# Patient Record
Sex: Female | Born: 1950 | Race: White | Hispanic: No | Marital: Married | Smoking: Former smoker
Health system: Southern US, Academic
[De-identification: ages and names within clinical notes are randomized; demographics above are authoritative.]

## PROBLEM LIST (undated history)

## (undated) DIAGNOSIS — R51 Headache: Secondary | ICD-10-CM

## (undated) DIAGNOSIS — K5792 Diverticulitis of intestine, part unspecified, without perforation or abscess without bleeding: Secondary | ICD-10-CM

## (undated) DIAGNOSIS — R519 Headache, unspecified: Secondary | ICD-10-CM

## (undated) DIAGNOSIS — R569 Unspecified convulsions: Secondary | ICD-10-CM

## (undated) DIAGNOSIS — E039 Hypothyroidism, unspecified: Secondary | ICD-10-CM

## (undated) DIAGNOSIS — R12 Heartburn: Secondary | ICD-10-CM

## (undated) DIAGNOSIS — G43909 Migraine, unspecified, not intractable, without status migrainosus: Secondary | ICD-10-CM

## (undated) DIAGNOSIS — E079 Disorder of thyroid, unspecified: Secondary | ICD-10-CM

## (undated) DIAGNOSIS — M810 Age-related osteoporosis without current pathological fracture: Secondary | ICD-10-CM

## (undated) HISTORY — PX: COLOSTOMY REVERSAL: SHX5782

## (undated) HISTORY — PX: APPENDECTOMY: SHX54

## (undated) HISTORY — DX: Age-related osteoporosis without current pathological fracture: M81.0

## (undated) HISTORY — PX: HX APPENDECTOMY: SHX54

## (undated) HISTORY — DX: Migraine, unspecified, not intractable, without status migrainosus: G43.909

---

## 2003-07-05 ENCOUNTER — Encounter: Payer: Self-pay | Admitting: Unknown Physician Specialty

## 2003-07-05 ENCOUNTER — Ambulatory Visit (HOSPITAL_COMMUNITY): Admission: RE | Admit: 2003-07-05 | Discharge: 2003-07-05 | Payer: Self-pay | Admitting: Unknown Physician Specialty

## 2005-03-21 ENCOUNTER — Ambulatory Visit: Payer: Self-pay | Admitting: Family Medicine

## 2005-06-05 ENCOUNTER — Ambulatory Visit: Payer: Self-pay | Admitting: Family Medicine

## 2005-08-12 ENCOUNTER — Ambulatory Visit: Payer: Self-pay | Admitting: Family Medicine

## 2006-08-12 ENCOUNTER — Ambulatory Visit (HOSPITAL_COMMUNITY): Admission: RE | Admit: 2006-08-12 | Discharge: 2006-08-12 | Payer: Self-pay | Admitting: Family Medicine

## 2006-08-25 ENCOUNTER — Ambulatory Visit (HOSPITAL_COMMUNITY): Admission: RE | Admit: 2006-08-25 | Discharge: 2006-08-25 | Payer: Self-pay | Admitting: *Deleted

## 2006-09-01 ENCOUNTER — Ambulatory Visit (HOSPITAL_COMMUNITY): Admission: RE | Admit: 2006-09-01 | Discharge: 2006-09-01 | Payer: Self-pay | Admitting: *Deleted

## 2006-09-18 ENCOUNTER — Ambulatory Visit (HOSPITAL_COMMUNITY): Admission: RE | Admit: 2006-09-18 | Discharge: 2006-09-18 | Payer: Self-pay | Admitting: *Deleted

## 2007-01-07 ENCOUNTER — Inpatient Hospital Stay (HOSPITAL_COMMUNITY): Admission: EM | Admit: 2007-01-07 | Discharge: 2007-01-12 | Payer: Self-pay | Admitting: Emergency Medicine

## 2007-01-07 ENCOUNTER — Ambulatory Visit (HOSPITAL_COMMUNITY): Admission: RE | Admit: 2007-01-07 | Discharge: 2007-01-07 | Payer: Self-pay | Admitting: *Deleted

## 2007-02-02 ENCOUNTER — Inpatient Hospital Stay (HOSPITAL_COMMUNITY): Admission: RE | Admit: 2007-02-02 | Discharge: 2007-03-04 | Payer: Self-pay | Admitting: *Deleted

## 2007-02-06 ENCOUNTER — Encounter (INDEPENDENT_AMBULATORY_CARE_PROVIDER_SITE_OTHER): Payer: Self-pay | Admitting: Specialist

## 2007-04-06 ENCOUNTER — Inpatient Hospital Stay (HOSPITAL_COMMUNITY): Admission: RE | Admit: 2007-04-06 | Discharge: 2007-04-14 | Payer: Self-pay | Admitting: *Deleted

## 2008-09-14 IMAGING — CT CT ABCESS DRAINAGE
3 of 4 series · 15 of 32 positions shown, 20 images · non-contrast
Comparison: none

CLINICAL DATA: Recurrent diverticular abscess

CT guided pelvic drain abscess catheter placement:
TECHNIQUE: Select axial scans through the pelvis were obtained and an
appropriate skin entry site determined. Skin site was marked, prepped with
Betadine, and draped in usual sterile fashion, and infiltrated locally with 1%
lidocaine. Intravenous fentanyl and Versed were administered as conscious
sedation during continuous cardiorespiratory monitoring by the radiology RN.

[Series 2: diverticular abs 5.0 b40f st · axial · 0.76mm/px · z∈[-264,-204]mm · 8 of 16 slices shown, 13 images (1 of 2)]
[im 2/16  soft-tissue]
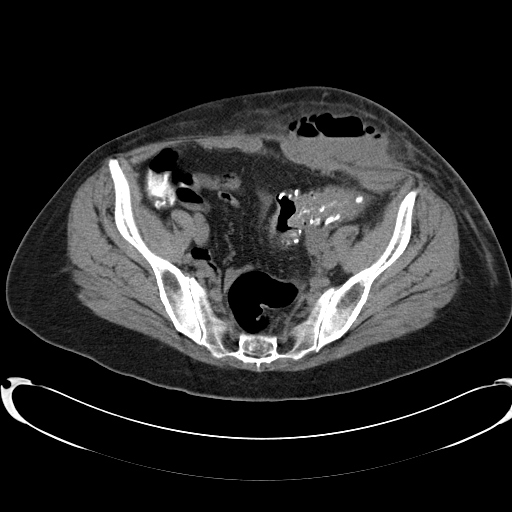
[im 2/16  bone]
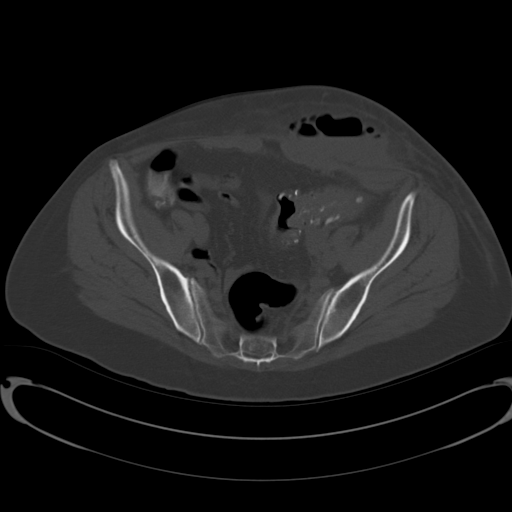
[im 4/16  soft-tissue]
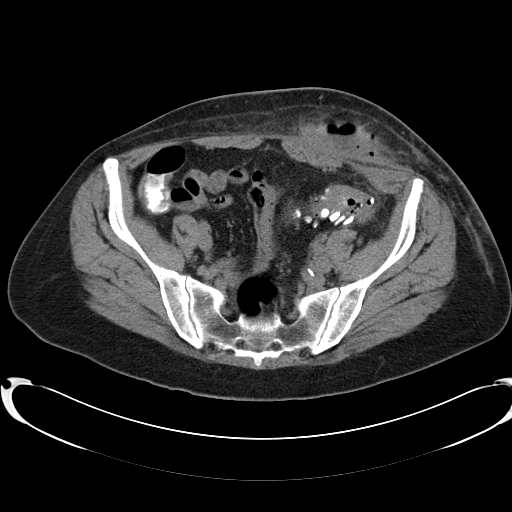
[im 6/16  soft-tissue]
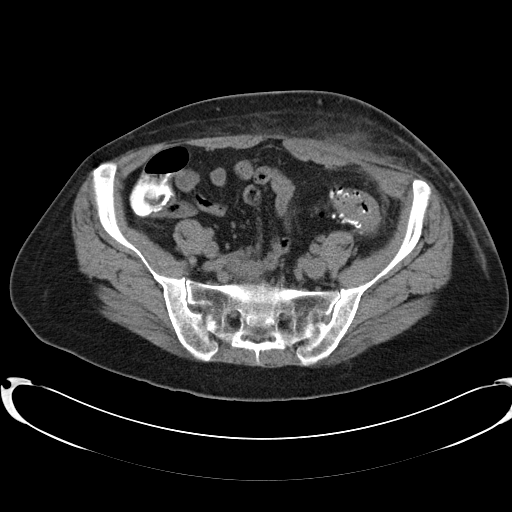
[im 7/16  soft-tissue]
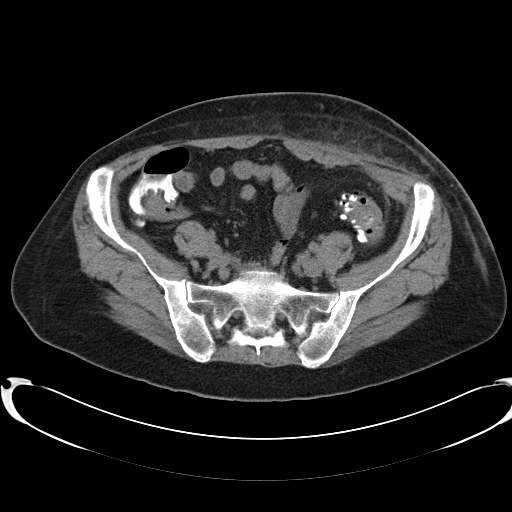
[im 9/16  soft-tissue]
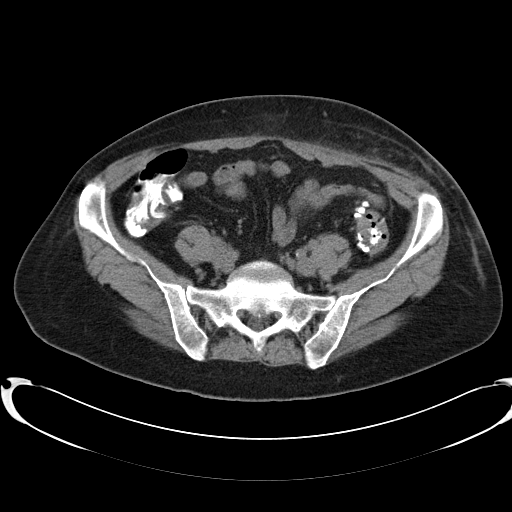
[im 9/16  lung]
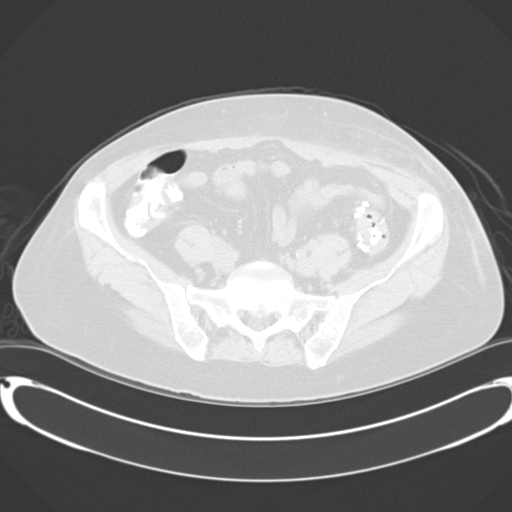
[im 11/16  soft-tissue]
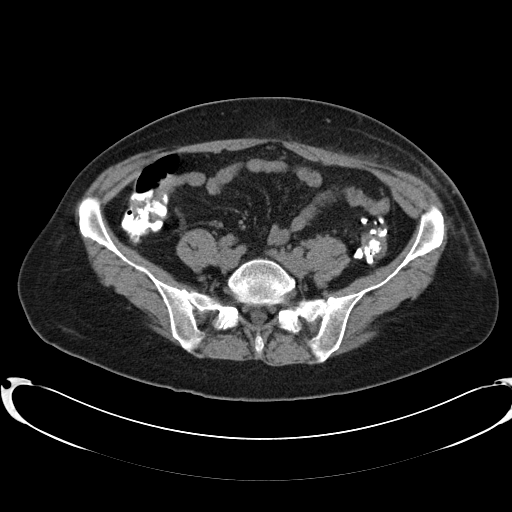
[im 11/16  lung]
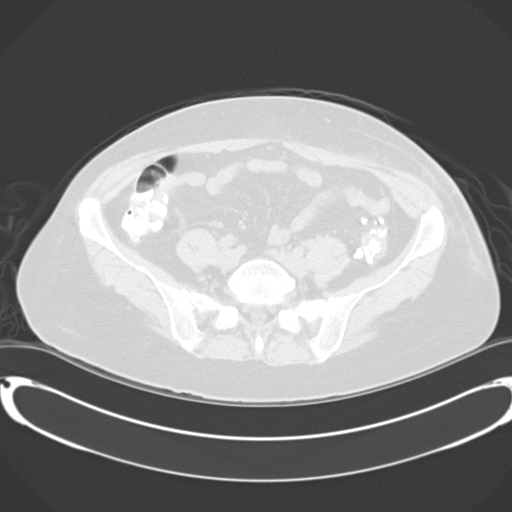
[im 12/16  soft-tissue]
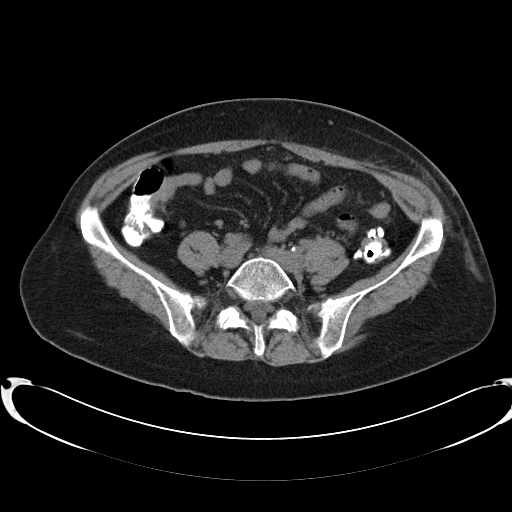
[im 12/16  lung]
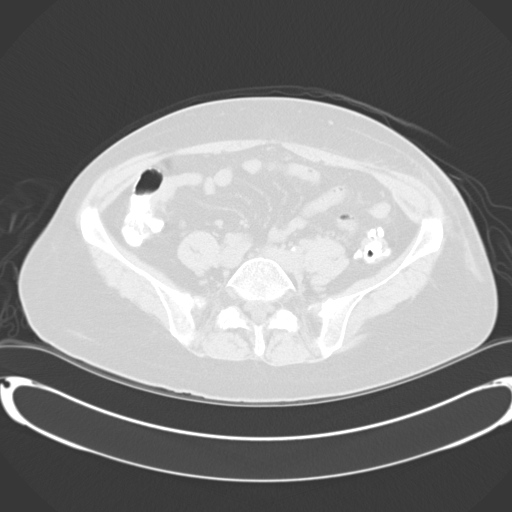
[im 14/16  soft-tissue]
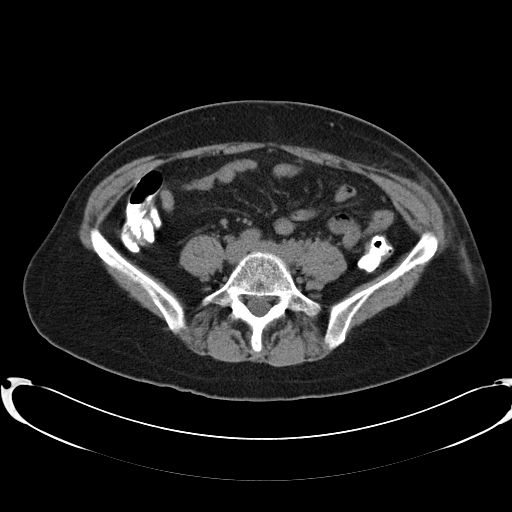
[im 14/16  lung]
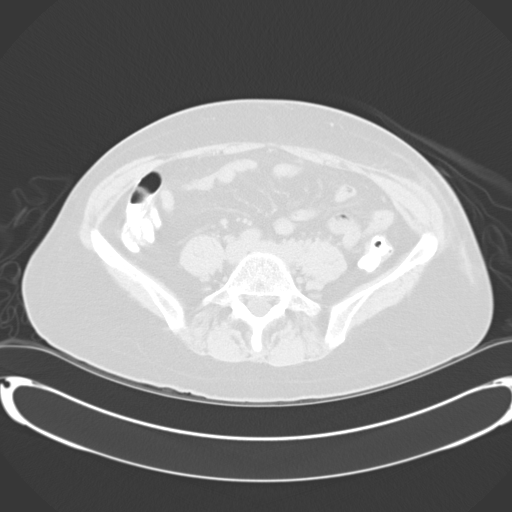

[Series 3: diverticular abs 5.0 b40f st · axial · 0.76mm/px · z∈[-289,-259]mm · 4 of 10 slices shown (2 of 2)]
[im 2/10  soft-tissue]
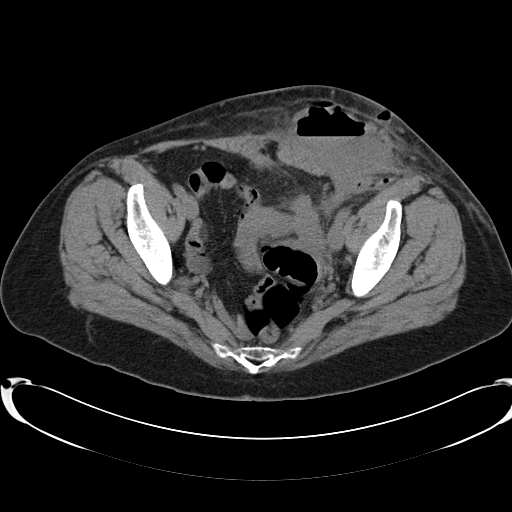
[im 4/10  soft-tissue]
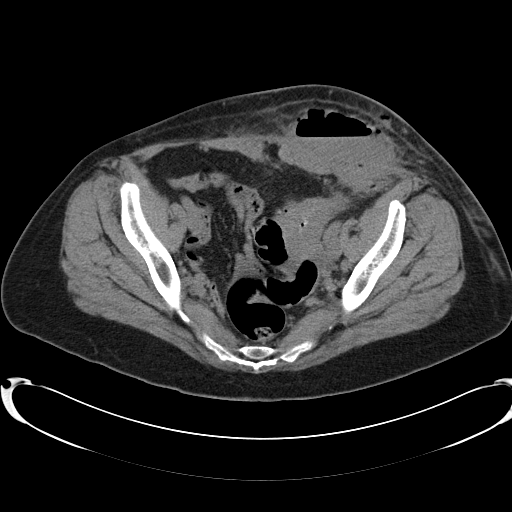
[im 6/10  soft-tissue]
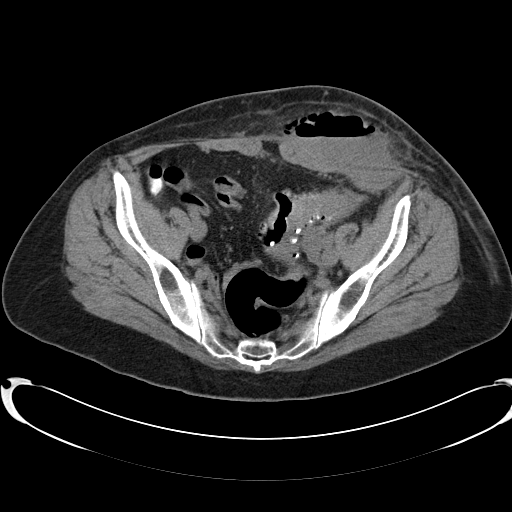
[im 8/10  soft-tissue]
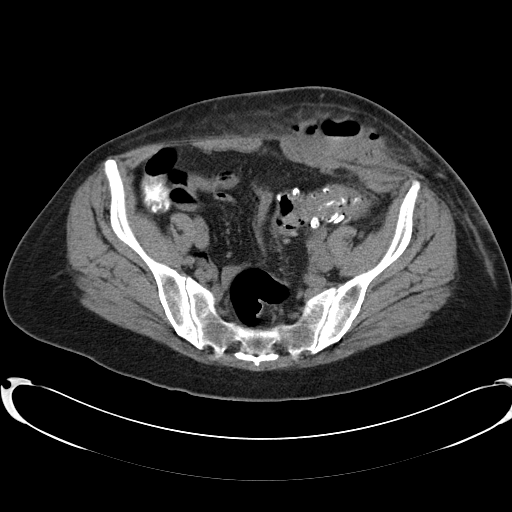

[Series 5: (hospital) 6.0 b30f · axial · 1.48mm/px · z∈[-296,-292]mm · 3 of 24 slices shown]
[im 2/24  soft-tissue]
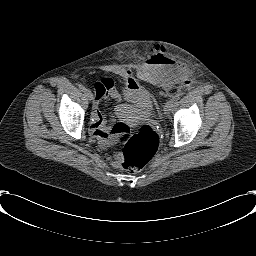
[im 5/24  soft-tissue]
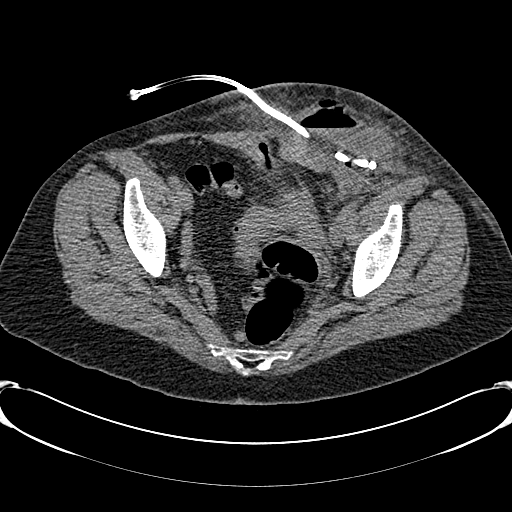
[im 9/24  soft-tissue]
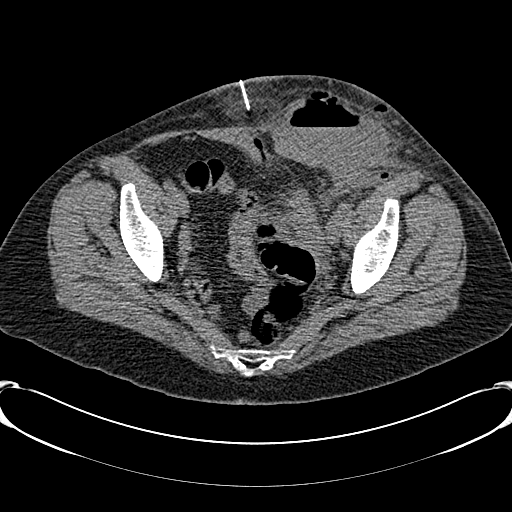

[15 of 32 positions shown; findings below may reference images not displayed]

Under CT fluoroscopic guidance a 19-gauge needle was advanced into the left deep
subcutaneous pelvic gas and fluid collection. Purulent material could be
aspirated. The needle was exchanged over guidewire for serial vascular dilators
which allowed advancement of a 12 French pigtail catheter, formed within the
dependent aspect of the collection. Approximately 65 mL of purulent fluid were
aspirated, a sample sent for gram stain culture. Catheter secured externally
with 0 Prolene suture and placed to drain bag. Patient tolerated procedure well,
with no immediate complication.
IMPRESSION: 1. Technically successful CT guided pelvic abscess drain catheter placement.

## 2008-12-14 ENCOUNTER — Ambulatory Visit (HOSPITAL_COMMUNITY)
Admission: RE | Admit: 2008-12-14 | Discharge: 2008-12-14 | Disposition: A | Discharge status: Home / Self Care | Payer: Self-pay | Source: Ambulatory Visit

## 2009-01-16 ENCOUNTER — Ambulatory Visit (HOSPITAL_COMMUNITY)
Admission: RE | Admit: 2009-01-16 | Discharge: 2009-01-16 | Disposition: A | Discharge status: Home / Self Care | Payer: Self-pay | Source: Ambulatory Visit

## 2009-09-11 ENCOUNTER — Ambulatory Visit (HOSPITAL_COMMUNITY)
Admission: RE | Admit: 2009-09-11 | Discharge: 2009-09-11 | Disposition: A | Discharge status: Home / Self Care | Payer: Self-pay | Source: Ambulatory Visit

## 2010-12-26 ENCOUNTER — Emergency Department (HOSPITAL_COMMUNITY)
Admission: EM | Admit: 2010-12-26 | Discharge: 2010-12-26 | Payer: Self-pay | Source: Home / Self Care | Admitting: Emergency Medicine

## 2010-12-30 LAB — COMPREHENSIVE METABOLIC PANEL
ALT: 16 U/L (ref 0–35)
AST: 18 U/L (ref 0–37)
Albumin: 3.9 g/dL (ref 3.5–5.2)
Alkaline Phosphatase: 80 U/L (ref 39–117)
BUN: 12 mg/dL (ref 6–23)
Chloride: 104 mEq/L (ref 96–112)
Potassium: 3.9 mEq/L (ref 3.5–5.1)
Sodium: 140 mEq/L (ref 135–145)
Total Bilirubin: 0.8 mg/dL (ref 0.3–1.2)
Total Protein: 7.2 g/dL (ref 6.0–8.3)

## 2010-12-30 LAB — CBC
MCV: 94.1 fL (ref 78.0–100.0)
Platelets: 312 10*3/uL (ref 150–400)
RBC: 5.09 MIL/uL (ref 3.87–5.11)
RDW: 13.1 % (ref 11.5–15.5)
WBC: 13.6 10*3/uL — ABNORMAL HIGH (ref 4.0–10.5)

## 2010-12-30 LAB — DIFFERENTIAL
Basophils Absolute: 0 10*3/uL (ref 0.0–0.1)
Eosinophils Absolute: 0.1 10*3/uL (ref 0.0–0.7)
Eosinophils Relative: 1 % (ref 0–5)
Lymphocytes Relative: 30 % (ref 12–46)
Lymphs Abs: 4 10*3/uL (ref 0.7–4.0)
Neutrophils Relative %: 63 % (ref 43–77)

## 2011-04-25 NOTE — H&P (Signed)
[name], [name] NO.:  [id number]   MEDICAL RECORD NO.:  [id number]          PATIENT TYPE:  OUT   LOCATION:  CATS                         FACILITY:  [facility]   PHYSICIAN:  Clovis Pu. Cornett, M.D.DATE OF BIRTH:  [birth year]   DATE OF ADMISSION:  01/07/2007  DATE OF DISCHARGE:                              HISTORY & PHYSICAL   CHIEF COMPLAINT:  Abdominal pain.   HISTORY OF PRESENT ILLNESS:  The patient is a 60 year old female with A  past history of acute diverticulitis complicated by abscess back in  September of 2007.  She underwent percutaneous drainage and antibiotics.  She had a second attack a month later which was treated medically.  She  represents to the office with a 5-day history of severe left lower  quadrant pain.  There is no radiation..  The pain is made worse by  moving or sitting upward.  She also complains of passing air and having  pain with urination consisting of burning and passage of air after she  passes her urine.  This has been going on for the past 2 months.  She  denies any weight loss, blood in her stool or urine.   PAST MEDICAL HISTORY:  1. History of acute diverticulitis complicated by abscess back in      September 2007 seen by Dr. Colin Benton  2. History of tobacco abuse.  3. Hypothyroidism.   PAST SURGICAL HISTORY:  1. Appendectomy.  2. Hysterectomy.   ALLERGIES:  None.   MEDICATIONS INCLUDE:  1. Synthroid 125 mcg daily.  2. She is not Cipro and Flagyl at home.  3. Vicodin as needed for pain.   SOCIAL HISTORY:  Positive for tobacco use.  Denies alcohol use.   FAMILY HISTORY:  Positive for hypertension.   REVIEW OF SYSTEMS:  A 15-point review of systems is as stated, as above,  otherwise negative.   PHYSICAL EXAM:  VITAL SIGNS:  Temperature 97, pulse 67, respiratory rate  is 20.  GENERAL APPEARANCE:  White female in no apparent distress.  HEENT:  Extraocular movements are intact.  Oropharynx clear.  NECK:  Supple,  nontender.  \CHEST:  Clear to auscultation.  CARDIOVASCULAR:  Regular rate and rhythm without murmur or gallop.  ABDOMEN:  Tender in the left lower quadrant without diffuse peritonitis.  There is a palpable mass in left lower quadrant.  No hernia.  EXTREMITIES:  No clubbing, cyanosis, nor edema.   CT SCAN:  Reveals an abscess tracking from the sigmoid colon to her left  rectus muscle.  There was a second tract from her sigmoid colon to the  dome of her tumor bladder.  There is no free air or free fluid on CT.   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   PLAN:  She will be admitted for IV antibiotics, IV fluids n.p.o. status.  We will ask interventional radiology for percutaneous drainage in the  morning.      Thomas A. Cornett, M.D.  Electronically Signed  TAC/MEDQ  D:  01/07/2007  T:  01/07/2007  Job:  [id number]

## 2011-04-25 NOTE — Discharge Summary (Signed)
NAMEGABRIELL, DAIGNEAULT NO.:  [id number]   MEDICAL RECORD NO.:  [id number]          PATIENT TYPE:  INP   LOCATION:  1421                         [facility]ACILITY:  [facility]   PHYSICIAN:  Alfonse Ras, MD   DATE OF BIRTH:  1951/07/21   DATE OF ADMISSION:  02/01/2007  DATE OF DISCHARGE:  03/04/2007                               DISCHARGE SUMMARY   ADMISSION DIAGNOSIS:  History of recurring sigmoid diverticulitis.   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   DISPOSITION:  Discharged to home.   DISCHARGE MEDICATIONS:  Reglan 10 mg p.o. q.6h. p.r.n. nausea.   DISCHARGE FOLLOWUP:  Follow up with me in 2 weeks.   HOME CARE INSTRUCTIONS:  Twice daily dressing changes Normal Saline wet  to dry per home health care.   HOSPITAL COURSE:  The patient was admitted after recurrent  diverticulitis and treated with IV antibiotics.  The patient did not  resolve, had what appeared to be abdominal wall abscess and had a drain  placed within it.  The patient continued to have some emesis and some  drainage and low grade temperatures.  Her white count decreased.  We  started a gentle bowel prep, planning on doing a sigmoid colectomy.  Urology was consulted for placement of a ureteral stent.  On March 1,  the patient was taken to the operating room, underwent a Hartmann  procedure, take-down of colovesical fistula, Dr. Gaylyn Lambert was assisted.  Dr.  Londell Moh Kimbrough placed the ureteral stent.  The patient did well over  the first week and bowel function appeared to return.  Foley was kept in  place.  Wound was treated with Normal Saline wet to dry dressings.   By postoperative day #7 and 8, the patient continued with worsening  nausea, had difficulty taking down clear liquids.  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  Dr. Zachery Dakins managed the  patient in my absence.  NG tube was placed and Dobbhoff feeding tube was  placed.  The patient underwent PICC line placement and was started on  TNA.   By postoperative day #16, she was maintained on TNA and maintained on  antibiotics and feeding tube was placed by Interventional Radiology on  March 17.  This was maintained and replaced after it slipped back; but  by March 23, she was feeling much better and NG tube output was markedly  decreased.  She underwent a clamping trial which she tolerated over 24  hours with clear liquids.  NG tube was removed.  She was then started on  full liquids and then  advanced to a regular diet.  Panda tube was removed.  Wound was clean  and she was ready for discharge home by March 04, 2007.   She is discharged home.  Plans are for followup with me in 2-3 weeks and  home health care as described above.      Alfonse Ras,  MD  Electronically Signed     KRE/MEDQ  D:  03/04/2007  T:  03/04/2007  Job:  [id number]

## 2011-04-25 NOTE — Consult Note (Signed)
[name], [name] NO.:  [id number]   MEDICAL RECORD NO.:  [id number]          PATIENT TYPE:  OBV   LOCATION:  1421                         FACILITY:  [facility]   PHYSICIAN:  Bertram Millard. Dahlstedt, M.D.DATE OF BIRTH:  [birth year]   DATE OF CONSULTATION:  02/01/2007  DATE OF DISCHARGE:                                 CONSULTATION   REASON FOR CONSULTATION:  Colovesical fistula.   BRIEF HISTORY:  This is a 60 year old female who was readmitted today by  Dr. Simona Huh for treatment of abdominal wall abscess and recurrent  diverticulitis with colovesical fistula.  She initially presented  September of last year with an abscess.  She underwent drainage  percutaneously and IV antibiotic therapy.  She was treated again a month  later for this.  She was readmitted at the end of this January for  recurrent symptoms.  She underwent IV antibiotic management, and  percutaneous drainage of an abdominal wall abscess.  She has been  maintained on oral antibiotics since her discharge on February 5.  Over  the past few days, she has been getting worse, has had blood in her  urine, as well as air, as well as pneumatory.  She was readmitted for IV  antibiotic therapy, and drainage of this abscess percutaneously under CT  revealed this recurrent abscess.  Urologic consultation is requested to  consider ureteral catheterization to prevent injury to her left ureter  during her laparotomy.   MEDICAL HISTORY:  Significant for hypothyroidism.  She has a history of  tobacco use.  She has previously mentioned episodes of diverticulitis.  She has had a hysterectomy and appendectomy.   MEDICATIONS:  Time of admission to include Cipro, Flagyl and Synthroid.   ALLERGIES:  SHE DENIES ANY DRUG ALLERGIES.   SOCIAL HISTORY:  She is married.  She does not have children.  She does  use tobacco.  She denies alcohol use.   FAMILY HISTORY:  Significant for hypertension.   EXAM:  GENERAL:  Pleasant  middle-aged female.  VITAL SIGNS:  Blood pressure 123/86, pulse 102, respiratory rate 22,  temperature 100.6.  GI:  She had mild mildly protuberant abdomen with significant mass  effect and exquisite tenderness in the left lower quadrant.  Some  rebound and obvious guarding.  She had no peripheral edema.   White count 16,100, hematocrit 37.5%, platelet count 279,000.  BMET was  normal, except for glucose of 113 and sodium of 134.   IMPRESSION:  1. 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  2. Abdominal wall abscess.   PLAN:  1. Agree with a ventral stent placement.  At this needs to be done      urgently, I can do this under a separate anesthetic before her      laparotomy.  Otherwise, this can be performed at the same time as      her laparotomy.  2. I will be more than help more than happy to assist with      laparotomy/cystorrhaphy.      Bertram Millard. Dahlstedt, M.D.  Electronically Signed     SMD/MEDQ  D:  02/01/2007  T:  02/02/2007  Job:  [id number]   cc:   Alfonse Ras, MD  [address]  [place]  [place] [address]

## 2011-04-25 NOTE — H&P (Signed)
NAME:  [name], MCGLOIN NO.:  [id number]   MEDICAL RECORD NO.:  [id number]          PATIENT TYPE:  OBV   LOCATION:  1421                         FACILITY:  [facility]   PHYSICIAN:  Alfonse Ras, MD   DATE OF BIRTH:  1951-09-13   DATE OF ADMISSION:  02/01/2007  DATE OF DISCHARGE:                              HISTORY & PHYSICAL   HISTORY AND PHYSICAL:  Patient is a 60 year old white female with now  her third recurrent abscess in the left lower quadrant from sigmoid  diverticulitis.  She has been treated by percutaneous drainage and  conservative management, hoping to do a first stage procedure on her.  The patient now presents with a three day history of air per her  bladder, nausea, vomiting, and left lower quadrant fullness.  She  underwent CT scan as an outpatient, which shows recurrence of the  abscess with obvious fistula to the anterior abdominal wall with fluid  within it.   PAST MEDICAL HISTORY:  Unchanged since her last visit.   Medications include Synthroid, Cipro, Flagyl.   PAST SURGICAL HISTORY:  Significant for hysterectomy and appendectomy.   REVIEW OF SYSTEMS:  Significant, as above.   PHYSICAL EXAMINATION:  VITAL SIGNS:  Temperature 99.6, heart rate 78,  blood pressure 126/80, respiratory rate 16.  HEENT:  Benign.  Normocephalic and atraumatic.  Pupils are equal, round  and reactive to light.  LUNGS:  Clear to auscultation and percussion.  She does have left lower  lung bilateral rhonchi.  CARDIAC:  Regular rate and rhythm without murmurs, rubs or gallops.  ABDOMEN:  Full and quite tender in the left lower quadrant.  She does  have bowel sounds.   CT shows a recurrent abdominal wall abscess, as described above.   PLAN:  Admit.  IV antibiotics.  Probable placement of left ureteral  stent by urology.  Bowel prep and Hartmann colostomy at some point after  percutaneous drainage.      Alfonse Ras, MD  Electronically Signed     KRE/MEDQ  D:  02/01/2007  T:  02/01/2007  Job:  [id number]

## 2011-04-25 NOTE — Op Note (Signed)
[name], [name] NO.:  [id number]   MEDICAL RECORD NO.:  [id number]          PATIENT TYPE:  INP   LOCATION:  1421                         [facility]ACILITY:  [facility]   PHYSICIAN:  Alfonse Ras, MD   DATE OF BIRTH:  21-May-1951   DATE OF PROCEDURE:  02/06/2007  DATE OF DISCHARGE:                               OPERATIVE REPORT   PREOPERATIVE DIAGNOSIS:  Colovesical fistula and diverticulitis.   POSTOPERATIVE DIAGNOSIS:  Colovesical fistula and diverticulitis.   PROCEDURE:  Exploratory laparotomy Hartmann resection and takedown of  colovesical fistula.   SURGEON:  Dr. Baruch Merl.   ASSISTANT:  Dr. Jaclynn Guarneri.   ANESTHESIA:  General.   DESCRIPTION OF PROCEDURE:  The patient was taken to the operating room,  placed in a supine position and after adequate anesthesia was induced  using the endotracheal tube, the patient underwent left ureteral stent  placement by Dr. Vic Blackbird. At this point, a Foley catheter had  been placed and the abdomen was prepped and draped in the normal sterile  fashion. Using a vertical midline incision, I dissected down onto the  fascia and the fascia was opened.  There was a large amount of very  densely adhered omentum into the left lower quadrant.  This was  immobilized using the LigaSure.  The colon was then mobilized laterally  using Bovie electrocautery in the LigaSure.  The mucosa was seen on the  colonic side.  A GIA stapling device was then placed distal to the area  of inflammation protecting the bladder.  It was transected.  The  mesentery was taken down using the LigaSure.  The proximal colon in the  mid descending colon was transected using a GIA stapling device and some  of that mesentery was also taken down using the LigaSure.  There was  such dense inflammation of the omentum and adhesions to the bladder that  I opted not to try to close the bladder primarily.  A 19 Blake drain was  placed next to the bladder.   The abdomen was copiously irrigated and the  proximal colon was brought out through a cruciate incision in the left  anterior rectus sheath.  The remainder of the bowel appeared normal.  The fascia was closed with a running #1 Novofil.  The skin was closed  with interrupted staples and packed with saline-soaked gauze.  The  colostomy was matured in a standard fashion using interrupted 3-0  Vicryl. The colostomy device was placed, ureteral stent was removed.  A  sterile dressing was applied.  The patient tolerated the procedure well  and went to PACU in good condition.      Alfonse Ras, MD  Electronically Signed     KRE/MEDQ  D:  02/06/2007  T:  02/06/2007  Job:  [id number]

## 2011-04-25 NOTE — Discharge Summary (Signed)
[name], [name] NO.:  [id number]   MEDICAL RECORD NO.:  [id number]          PATIENT TYPE:  INP   LOCATION:  1525                         FACILITY:  [facility]   PHYSICIAN:  Alfonse Ras, MD   DATE OF BIRTH:  November 11, 1951   DATE OF ADMISSION:  04/06/2007  DATE OF DISCHARGE:  04/14/2007                               DISCHARGE SUMMARY   ADMISSION DIAGNOSIS:  Status post Hartmann procedure.   DISCHARGE DIAGNOSIS:  Status post Hartmann procedure.   PROCEDURES:  Include take down of colostomy and extensive lysis of  adhesions.   CONDITION ON DISCHARGE:  Good and improved.   FOLLOWUP:  With me in 2 weeks.   MEDICATIONS AT DISCHARGE:  Include Darvocet for pain and levothyroxine  daily.   HOSPITAL COURSE:  The patient was admitted after home bowel prep and  underwent extensive lysis of adhesions and takedown of colostomy.  She  suffered from some hypokalemia requiring repletion over the initial 3-4  days postop, but this resolved on postoperative day #6. Her NG tube was  removed.  She was started on clear liquids.  She tolerated that.  She  was then advanced to a regular diet which she tolerated well and was  ready for discharge home on Apr 14, 2007.      Alfonse Ras, MD  Electronically Signed     KRE/MEDQ  D:  04/14/2007  T:  04/14/2007  Job:  [id number]

## 2011-04-25 NOTE — Op Note (Signed)
NAMESMITA, LESH NO.:  [id number]   MEDICAL RECORD NO.:  [id number]          PATIENT TYPE:  INP   LOCATION:  1525                         [facility]ACILITY:  [facility]   PHYSICIAN:  Alfonse Ras, MD   DATE OF BIRTH:  [birth year]   DATE OF PROCEDURE:  04/06/2007  DATE OF DISCHARGE:                               OPERATIVE REPORT   PREOPERATIVE DIAGNOSIS:  Status post Hartmann procedure with colostomy.   POSTOPERATIVE DIAGNOSIS:  Status post Hartmann procedure with colostomy.  Extensive intra-abdominal adhesions.   PROCEDURE:  Laparoscopy, exploratory laparotomy, take down of colostomy  and extensive lysis of adhesions.   SURGEON:  Alfonse Ras, MD   ASSISTANT:  Anselm Pancoast. Zachery Dakins, M.D.   ANESTHESIA:  General.   DESCRIPTION:  The patient was taken to the operating room, placed in  supine position. After adequate general anesthesia was induced using  endotracheal tube the colostomy was closed using a pursestring suture.  The right upper quadrant was accessed with an 11 mm OptiVu trocar under  direct vision.  Peritoneal access was obtained.  Pneumoperitoneum was  obtained.  On visualization of the abdomen there was extensive adhesions  of the small bowel and omentum to the anterior abdominal wall.  I found  this prohibitive to further laparoscopic approach and I opted to open.   The previous lower abdominal vertical incision scar was removed in an  elliptical fashion.  Fascia was then incised and the abdomen was  entered.  The dense omental adhesions were taken down under tedious  dissection.  There were also extensive small bowel adhesions both near  the colostomy and in the pelvis.  This required probably about 90  minutes of extensive dissection to mobilize the small bowel from where  her previous percutaneous drains had been and her diverticulitis.  Once  the entire small bowel was mobilized it was ran from the ligament of  Treitz down to the terminal  ileum.  A small enterotomy in the mid  jejunum was identified and closed with interrupted 3-0 silk sutures.  The colostomy was then excised at the skin level, mobilized down to the  fascia and brought into the abdomen.  It was clamped with a spring clamp  proximal.  I then turned my attention to the pelvis.  The distal rectum  was identified with the previously placed Prolene sutures and was  mobilized using the LigaSure and with blunt and sharp dissection.  This  was right at the pelvic brim and therefore I opted to do a hand-sewn  anastomosis.  The end of the colostomy was then excised as was the  proximal end of the stump.  A single layer 2-0 silk interrupted closure  was then obtained starting on the posterior side.  Tisseel was placed  over the anastomosis.  Tisseel was also placed on the small enterotomy  in the small bowel.  The abdomen was copiously irrigated.  Adequate  hemostasis was assured.  The colostomy fascial defect was closed with  interrupted #1 Novofil.  The skin of the midline fascia was closed with  a  running #1 Novofil.  Skin was closed with staples.  Telfa was placed  in between the staples and the colostomy closure at the skin level.  Sterile dressings were applied.  The patient tolerated the procedure  well and went to PACU in good condition.      Alfonse Ras, MD  Electronically Signed     KRE/MEDQ  D:  04/07/2007  T:  04/07/2007  Job:  [id number]

## 2011-04-25 NOTE — Op Note (Signed)
[name], [name] NO.:  [id number]   MEDICAL RECORD NO.:  [id number]          PATIENT TYPE:  INP   LOCATION:  1421                         [facility]   PHYSICIAN:  Courtney Paris, M.D.DATE OF BIRTH:  06-Aug-1951   DATE OF PROCEDURE:  02/06/2007  DATE OF DISCHARGE:                               OPERATIVE REPORT   PREOPERATIVE DIAGNOSIS:  Left colovesical fistula.   POSTOPERATIVE DIAGNOSIS:  Left colovesical fistula.   OPERATIONS:  Cystoscopy, left retrograde pyelogram, insertion of left  ureteral stent.   ANESTHESIA:  General.   SURGEON:  Courtney Paris, M.D.   BRIEF HISTORY:  This 59 year old lady has a sigmoid abscess secondary to  diverticulitis that has eroded into the bladder with a colovesical  fistula.  Dr. Rito Ehrlich was to resect this today.  She wanted a placement  of ureteral stent prior to the procedure to help identify and protect  the ureter.   DESCRIPTION OF PROCEDURE:  The patient was placed on the operating table  in dorsal lithotomy position.  After satisfactory induction of general  anesthesia, was prepped and draped with Betadine in the usual sterile  fashion.  A #22 panendoscope was inserted and the bladder inspected.  The fistula was a little more lateral on the bladder wall than is usual  and a little bit more distal and closer to the left ureteral orifices.  It was a few centimeters away from this.  But it was not up the dome as  it normally is.  The rest the bladder looked fine.  I made some pictures  of the inflammatory lesion.  A #6 open-ended ureteral catheter was  inserted into the orifice with the aid of a guidewire and then the  guidewire was removed.   An occlusive retrograde demonstrated normal appearance of the ureter up  to the renal pelvis which looked normal except for slightly blunted.  I  was able to pass, under fluoroscopy, the open-ended catheter up to the  renal pelvis where he had a nice drip.   I then removed the cystoscope  and attached the ureteral catheter to its own drainage bag and taped  this to a Foley catheter to keep it from being dislodged.  That way the  stent could be either left indwelling if need be or could be removed  postoperatively easily without problems.  I told Dr. Colin Benton I would be  happy to help resect bladder part if she wishes.      Courtney Paris, M.D.  Electronically Signed     HMK/MEDQ  D:  02/06/2007  T:  02/06/2007  Job:  [id number]

## 2011-04-25 NOTE — Discharge Summary (Signed)
[name], [name] NO.:  [id number]   MEDICAL RECORD NO.:  [id number]          PATIENT TYPE:  INP   LOCATION:  6735                         FACILITY:  [facility]   PHYSICIAN:  Alfonse Ras, MD   DATE OF BIRTH:  [birth year]   DATE OF ADMISSION:  01/07/2007  DATE OF DISCHARGE:  01/12/2007                               DISCHARGE SUMMARY   ADMISSION DIAGNOSIS:  Recurrent diverticulitis.   DISCHARGE DIAGNOSIS:  Recurrent diverticulitis, colovesical fistula.   ADMITTING PHYSICIAN:  Alfonse Ras, MD.   CONDITION ON DISCHARGE:  Good and improved.   MEDICATIONS AT DISCHARGE:  Include:  1. Ciprofloxacin 500 mg one p.o. b.i.d.  2. Flagyl 500 mg b.i.d.  3. She is also on Synthroid 125 mcg daily.   DISPOSITION:  Discharged to home.   HOSPITAL COURSE:  Patient was admitted, 60 year old white female with a  history of acute diverticulitis with an abscess in September of 2007.  She underwent percutaneous drainage and antibiotics.  She had a second  attack about a month later, which was treated nonoperatively.  We were  planning on scheduling her for elective sigmoid colectomy when she  presented with recurrent pain, was found to have an abdominal wall  abscess as well as what appeared to be a colovesical fistula on CT scan.  She was admitted, had a percutaneous drain placed, started on  antibiotics, which she tolerated well, and by hospital day number five  she was tolerating a regular diet.  The drain had been removed.  Followup CT scan showed complete resolution of the abscess but  persistence of colovesical fistula with minimal air tracking.  The  patient went ahead and was discharged home.  Plans are for followup with  me in three weeks for probable elective sigmoid colectomy, hopefully one  stage.      Alfonse Ras, MD  Electronically Signed     KRE/MEDQ  D:  01/12/2007  T:  01/13/2007  Job:  [id number]

## 2015-02-20 DIAGNOSIS — G40909 Epilepsy, unspecified, not intractable, without status epilepticus: Secondary | ICD-10-CM

## 2015-03-07 ENCOUNTER — Encounter (HOSPITAL_COMMUNITY): Payer: Self-pay | Admitting: Emergency Medicine

## 2015-03-07 ENCOUNTER — Emergency Department (HOSPITAL_COMMUNITY): Payer: BLUE CROSS/BLUE SHIELD

## 2015-03-07 ENCOUNTER — Observation Stay (HOSPITAL_COMMUNITY)
Admission: EM | Admit: 2015-03-07 | Discharge: 2015-03-08 | Disposition: A | Discharge status: Home / Self Care | Payer: BLUE CROSS/BLUE SHIELD | Attending: Internal Medicine | Admitting: Internal Medicine

## 2015-03-07 DIAGNOSIS — R519 Headache, unspecified: Secondary | ICD-10-CM | POA: Diagnosis present

## 2015-03-07 DIAGNOSIS — K56600 Partial intestinal obstruction, unspecified as to cause: Secondary | ICD-10-CM | POA: Diagnosis present

## 2015-03-07 DIAGNOSIS — R51 Headache: Secondary | ICD-10-CM

## 2015-03-07 DIAGNOSIS — E785 Hyperlipidemia, unspecified: Secondary | ICD-10-CM | POA: Diagnosis not present

## 2015-03-07 DIAGNOSIS — R569 Unspecified convulsions: Secondary | ICD-10-CM

## 2015-03-07 DIAGNOSIS — E559 Vitamin D deficiency, unspecified: Secondary | ICD-10-CM | POA: Diagnosis not present

## 2015-03-07 DIAGNOSIS — E039 Hypothyroidism, unspecified: Secondary | ICD-10-CM | POA: Diagnosis not present

## 2015-03-07 DIAGNOSIS — R7309 Other abnormal glucose: Secondary | ICD-10-CM | POA: Diagnosis not present

## 2015-03-07 DIAGNOSIS — R911 Solitary pulmonary nodule: Secondary | ICD-10-CM | POA: Diagnosis not present

## 2015-03-07 DIAGNOSIS — Z87891 Personal history of nicotine dependence: Secondary | ICD-10-CM | POA: Diagnosis not present

## 2015-03-07 DIAGNOSIS — Z888 Allergy status to other drugs, medicaments and biological substances status: Secondary | ICD-10-CM | POA: Insufficient documentation

## 2015-03-07 DIAGNOSIS — R7303 Prediabetes: Secondary | ICD-10-CM | POA: Diagnosis present

## 2015-03-07 DIAGNOSIS — G40909 Epilepsy, unspecified, not intractable, without status epilepticus: Secondary | ICD-10-CM | POA: Diagnosis not present

## 2015-03-07 DIAGNOSIS — Z9049 Acquired absence of other specified parts of digestive tract: Secondary | ICD-10-CM | POA: Diagnosis not present

## 2015-03-07 DIAGNOSIS — R1013 Epigastric pain: Secondary | ICD-10-CM | POA: Diagnosis present

## 2015-03-07 DIAGNOSIS — G43909 Migraine, unspecified, not intractable, without status migrainosus: Secondary | ICD-10-CM | POA: Insufficient documentation

## 2015-03-07 HISTORY — DX: Headache, unspecified: R51.9

## 2015-03-07 HISTORY — DX: Diverticulitis of intestine, part unspecified, without perforation or abscess without bleeding: K57.92

## 2015-03-07 HISTORY — DX: Headache: R51

## 2015-03-07 HISTORY — DX: Unspecified convulsions: R56.9

## 2015-03-07 HISTORY — DX: Heartburn: R12

## 2015-03-07 HISTORY — DX: Hypothyroidism, unspecified: E03.9

## 2015-03-07 LAB — COMPREHENSIVE METABOLIC PANEL
ALBUMIN: 3.7 g/dL (ref 3.5–5.2)
ALT: 25 U/L (ref 0–35)
ANION GAP: 11 (ref 5–15)
AST: 26 U/L (ref 0–37)
Alkaline Phosphatase: 96 U/L (ref 39–117)
BUN: 8 mg/dL (ref 6–23)
CALCIUM: 9.6 mg/dL (ref 8.4–10.5)
CO2: 24 mmol/L (ref 19–32)
CREATININE: 0.91 mg/dL (ref 0.50–1.10)
Chloride: 105 mmol/L (ref 96–112)
GFR calc Af Amer: 77 mL/min — ABNORMAL LOW (ref >90)
GFR calc non Af Amer: 66 mL/min — ABNORMAL LOW (ref >90)
GLUCOSE: 142 mg/dL — AB (ref 70–99)
Potassium: 4.4 mmol/L (ref 3.5–5.1)
SODIUM: 140 mmol/L (ref 135–145)
TOTAL PROTEIN: 6.5 g/dL (ref 6.0–8.3)
Total Bilirubin: 0.5 mg/dL (ref 0.3–1.2)

## 2015-03-07 LAB — URINE MICROSCOPIC-ADD ON

## 2015-03-07 LAB — URINALYSIS, ROUTINE W REFLEX MICROSCOPIC
Glucose, UA: NEGATIVE mg/dL
Hgb urine dipstick: NEGATIVE
Ketones, ur: 15 mg/dL — AB
Leukocytes, UA: NEGATIVE
NITRITE: NEGATIVE
Protein, ur: 30 mg/dL — AB
Specific Gravity, Urine: 1.027 (ref 1.005–1.030)
UROBILINOGEN UA: 0.2 mg/dL (ref 0.0–1.0)
pH: 5 (ref 5.0–8.0)

## 2015-03-07 LAB — CBC WITH DIFFERENTIAL/PLATELET
Basophils Absolute: 0 10*3/uL (ref 0.0–0.1)
Basophils Relative: 0 % (ref 0–1)
Eosinophils Absolute: 0.1 10*3/uL (ref 0.0–0.7)
Eosinophils Relative: 1 % (ref 0–5)
HEMATOCRIT: 47.8 % — AB (ref 36.0–46.0)
HEMOGLOBIN: 16.2 g/dL — AB (ref 12.0–15.0)
Lymphocytes Relative: 14 % (ref 12–46)
Lymphs Abs: 2 10*3/uL (ref 0.7–4.0)
MCH: 31.7 pg (ref 26.0–34.0)
MCHC: 33.9 g/dL (ref 30.0–36.0)
MCV: 93.5 fL (ref 78.0–100.0)
MONOS PCT: 5 % (ref 3–12)
Monocytes Absolute: 0.7 10*3/uL (ref 0.1–1.0)
Neutro Abs: 11.9 10*3/uL — ABNORMAL HIGH (ref 1.7–7.7)
Neutrophils Relative %: 80 % — ABNORMAL HIGH (ref 43–77)
Platelets: 278 10*3/uL (ref 150–400)
RBC: 5.11 MIL/uL (ref 3.87–5.11)
RDW: 12.4 % (ref 11.5–15.5)
WBC: 14.7 10*3/uL — ABNORMAL HIGH (ref 4.0–10.5)

## 2015-03-07 LAB — LIPASE, BLOOD: LIPASE: 20 U/L (ref 11–59)

## 2015-03-07 LAB — I-STAT TROPONIN, ED: Troponin i, poc: 0 ng/mL (ref 0.00–0.08)

## 2015-03-07 LAB — OCCULT BLOOD X 1 CARD TO LAB, STOOL: Fecal Occult Bld: NEGATIVE

## 2015-03-07 MED ORDER — FENTANYL CITRATE 0.05 MG/ML IJ SOLN
50.0000 ug | Freq: Once | INTRAMUSCULAR | Status: AC
  Administered 2015-03-07: 50 ug via INTRAVENOUS
  Filled 2015-03-07: qty 2

## 2015-03-07 MED ORDER — SODIUM CHLORIDE 0.9 % IV SOLN
INTRAVENOUS | Status: DC
Start: 2015-03-07 — End: 2015-03-07

## 2015-03-07 MED ORDER — SODIUM CHLORIDE 0.9 % IV SOLN
Freq: Once | INTRAVENOUS | Status: AC
  Administered 2015-03-07: 09:00:00 via INTRAVENOUS

## 2015-03-07 MED ORDER — SODIUM CHLORIDE 0.9 % IV SOLN
INTRAVENOUS | Status: DC
  Administered 2015-03-07: 12:00:00 via INTRAVENOUS

## 2015-03-07 MED ORDER — CHLORHEXIDINE GLUCONATE 0.12 % MT SOLN
15.0000 mL | Freq: Two times a day (BID) | OROMUCOSAL | Status: DC
  Administered 2015-03-07 – 2015-03-08 (×2): 15 mL via OROMUCOSAL
  Filled 2015-03-07 (×2): qty 15

## 2015-03-07 MED ORDER — IOHEXOL 300 MG/ML  SOLN
25.0000 mL | Freq: Once | INTRAMUSCULAR | Status: AC | PRN
  Administered 2015-03-07: 25 mL via ORAL

## 2015-03-07 MED ORDER — SUMATRIPTAN SUCCINATE 50 MG PO TABS
50.0000 mg | ORAL_TABLET | ORAL | Status: DC | PRN
  Administered 2015-03-07: 50 mg via ORAL
  Filled 2015-03-07 (×2): qty 1

## 2015-03-07 MED ORDER — ACETAMINOPHEN 325 MG PO TABS
650.0000 mg | ORAL_TABLET | Freq: Four times a day (QID) | ORAL | Status: DC | PRN
  Administered 2015-03-07 – 2015-03-08 (×2): 650 mg via ORAL
  Filled 2015-03-07 (×2): qty 2

## 2015-03-07 MED ORDER — ONDANSETRON HCL 4 MG/2ML IJ SOLN
4.0000 mg | Freq: Once | INTRAMUSCULAR | Status: AC
  Administered 2015-03-07: 4 mg via INTRAVENOUS
  Filled 2015-03-07: qty 2

## 2015-03-07 MED ORDER — MORPHINE SULFATE 2 MG/ML IJ SOLN
2.0000 mg | INTRAMUSCULAR | Status: DC | PRN
  Administered 2015-03-08: 2 mg via INTRAVENOUS
  Filled 2015-03-07: qty 1

## 2015-03-07 MED ORDER — IOHEXOL 300 MG/ML  SOLN
100.0000 mL | Freq: Once | INTRAMUSCULAR | Status: AC | PRN
  Administered 2015-03-07: 100 mL via INTRAVENOUS

## 2015-03-07 MED ORDER — ONDANSETRON HCL 4 MG/2ML IJ SOLN: 4.0000 mg | Freq: Four times a day (QID) | INTRAMUSCULAR | Status: DC | PRN

## 2015-03-07 MED ORDER — GI COCKTAIL ~~LOC~~
30.0000 mL | Freq: Once | ORAL | Status: AC
  Administered 2015-03-07: 30 mL via ORAL
  Filled 2015-03-07: qty 30

## 2015-03-07 MED ORDER — LEVOTHYROXINE SODIUM 88 MCG PO TABS
88.0000 ug | ORAL_TABLET | Freq: Every day | ORAL | Status: DC
  Administered 2015-03-08: 88 ug via ORAL
  Filled 2015-03-07: qty 1

## 2015-03-07 MED ORDER — RIZATRIPTAN BENZOATE 10 MG PO TBDP: 10.0000 mg | ORAL_TABLET | ORAL | Status: DC | PRN

## 2015-03-07 MED ORDER — DIPHENHYDRAMINE HCL 50 MG/ML IJ SOLN
25.0000 mg | Freq: Once | INTRAMUSCULAR | Status: AC
Start: 2015-03-07 — End: 2015-03-07
  Administered 2015-03-07: 25 mg via INTRAVENOUS
  Filled 2015-03-07: qty 1

## 2015-03-07 MED ORDER — ENOXAPARIN SODIUM 40 MG/0.4ML ~~LOC~~ SOLN
40.0000 mg | SUBCUTANEOUS | Status: DC
Start: 2015-03-07 — End: 2015-03-08
  Administered 2015-03-07 – 2015-03-08 (×2): 40 mg via SUBCUTANEOUS
  Filled 2015-03-07 (×2): qty 0.4

## 2015-03-07 MED ORDER — VITAMIN D (ERGOCALCIFEROL) 1.25 MG (50000 UNIT) PO CAPS
50000.0000 [IU] | ORAL_CAPSULE | ORAL | Status: DC
  Administered 2015-03-08: 50000 [IU] via ORAL
  Filled 2015-03-07: qty 1

## 2015-03-07 MED ORDER — LEVETIRACETAM 500 MG PO TABS
500.0000 mg | ORAL_TABLET | Freq: Three times a day (TID) | ORAL | Status: DC
  Administered 2015-03-07 – 2015-03-08 (×4): 500 mg via ORAL
  Filled 2015-03-07 (×5): qty 1

## 2015-03-07 MED ORDER — SODIUM CHLORIDE 0.9 % IV BOLUS (SEPSIS)
500.0000 mL | Freq: Once | INTRAVENOUS | Status: AC
  Administered 2015-03-07: 500 mL via INTRAVENOUS

## 2015-03-07 MED ORDER — CETYLPYRIDINIUM CHLORIDE 0.05 % MT LIQD: 7.0000 mL | Freq: Two times a day (BID) | OROMUCOSAL | Status: DC

## 2015-03-07 MED ORDER — KETOROLAC TROMETHAMINE 60 MG/2ML IM SOLN
30.0000 mg | Freq: Once | INTRAMUSCULAR | Status: AC
  Administered 2015-03-07: 30 mg via INTRAMUSCULAR
  Filled 2015-03-07 (×2): qty 2

## 2015-03-07 MED ORDER — PROCHLORPERAZINE EDISYLATE 5 MG/ML IJ SOLN
10.0000 mg | Freq: Once | INTRAMUSCULAR | Status: AC
  Administered 2015-03-07: 10 mg via INTRAVENOUS
  Filled 2015-03-07: qty 2

## 2015-03-07 NOTE — ED Notes (Signed)
Admitting at bedside 

## 2015-03-07 NOTE — ED Notes (Signed)
Patient currently drinking contrast.  

## 2015-03-07 NOTE — ED Notes (Signed)
Reported to Dr. Starling MannsHarrision, patient is nauseous.

## 2015-03-07 NOTE — ED Provider Notes (Signed)
CSN: [id number]     Arrival date & time 03/07/15  0508 History   First MD Initiated Contact with Patient 03/07/15 [phone]     No chief complaint on file.    (Consider location/radiation/quality/duration/timing/severity/associated sxs/prior Treatment) Patient is a 64 y.o. female presenting with abdominal pain. The history is provided by the patient.  Abdominal Pain Pain location:  Epigastric Pain quality: cramping   Pain radiates to:  Back Pain severity:  Mild Onset quality:  Gradual Timing:  Constant Progression:  Unchanged Chronicity:  New Context comment:  Spontaneously Relieved by:  Nothing Worsened by:  Nothing tried Ineffective treatments:  None tried Associated symptoms: nausea and vomiting   Associated symptoms: no chest pain, no cough, no diarrhea, no dysuria, no fatigue, no fever, no hematuria and no shortness of breath     No past medical history on file. No past surgical history on file. No family history on file. History  Substance Use Topics  . Smoking status: Not on file  . Smokeless tobacco: Not on file  . Alcohol Use: Not on file   OB History    No data available     Review of Systems  Constitutional: Negative for fever and fatigue.  HENT: Negative for congestion and drooling.   Eyes: Negative for pain.  Respiratory: Negative for cough and shortness of breath.   Cardiovascular: Negative for chest pain.  Gastrointestinal: Positive for nausea, vomiting and abdominal pain. Negative for diarrhea.  Genitourinary: Negative for dysuria and hematuria.  Musculoskeletal: Negative for back pain, gait problem and neck pain.  Skin: Negative for color change.  Neurological: Negative for dizziness and headaches.  Hematological: Negative for adenopathy.  Psychiatric/Behavioral: Negative for behavioral problems.  All other systems reviewed and are negative.     Allergies  Review of patient's allergies indicates not on file.  Home Medications   Prior to  Admission medications   Not on File   There were no vitals taken for this visit. Physical Exam  Constitutional: She is oriented to person, place, and time. She appears well-developed and well-nourished.  HENT:  Head: Normocephalic.  Mouth/Throat: Oropharynx is clear and moist. No oropharyngeal exudate.  Eyes: Conjunctivae and EOM are normal. Pupils are equal, round, and reactive to light.  Neck: Normal range of motion. Neck supple.  Cardiovascular: Normal rate, regular rhythm, normal heart sounds and intact distal pulses.  Exam reveals no gallop and no friction rub.   No murmur heard. Pulmonary/Chest: Effort normal and breath sounds normal. No respiratory distress. She has no wheezes.  Abdominal: Soft. Bowel sounds are normal. There is tenderness (mild ttp of epig area). There is no rebound and no guarding.  Genitourinary:  Normal-appearing external rectum. Normal rectal exam. Brown stool. No evidence of blood.  Musculoskeletal: Normal range of motion. She exhibits no edema or tenderness.  Neurological: She is alert and oriented to person, place, and time.  Skin: Skin is warm and dry.  Psychiatric: She has a normal mood and affect. Her behavior is normal.  Nursing note and vitals reviewed.   ED Course  Procedures (including critical care time) Labs Review Labs Reviewed  CBC WITH DIFFERENTIAL/PLATELET - Abnormal; Notable for the following:    WBC 14.7 (*)    Hemoglobin 16.2 (*)    HCT 47.8 (*)    Neutrophils Relative % 80 (*)    Neutro Abs 11.9 (*)    All other components within normal limits  COMPREHENSIVE METABOLIC PANEL - Abnormal; Notable for the following:  Glucose, Bld 142 (*)    GFR calc non Af Amer 66 (*)    GFR calc Af Amer 77 (*)    All other components within normal limits  URINALYSIS, ROUTINE W REFLEX MICROSCOPIC - Abnormal; Notable for the following:    Color, Urine AMBER (*)    APPearance TURBID (*)    Bilirubin Urine SMALL (*)    Ketones, ur 15 (*)     Protein, ur 30 (*)    All other components within normal limits  URINE MICROSCOPIC-ADD ON - Abnormal; Notable for the following:    Bacteria, UA FEW (*)    Crystals CA OXALATE CRYSTALS (*)    All other components within normal limits  MRSA PCR SCREENING  LIPASE, BLOOD  OCCULT BLOOD X 1 CARD TO LAB, STOOL  COMPREHENSIVE METABOLIC PANEL  CBC  MAGNESIUM  PHOSPHORUS  HIV ANTIBODY (ROUTINE TESTING)  LACTIC ACID, PLASMA  I-STAT TROPOININ, ED    Imaging Review Ct Abdomen Pelvis W Contrast  03/07/2015   CLINICAL DATA:  Epigastric pain, back pain, history of bowel resection  EXAM: CT ABDOMEN AND PELVIS WITH CONTRAST  TECHNIQUE: Multidetector CT imaging of the abdomen and pelvis was performed using the standard protocol following bolus administration of intravenous contrast.  CONTRAST:  OMNIPAQUE IOHEXOL 300 MG/ML  SOLN  COMPARISON:  02/16/2007  FINDINGS: Lung bases shows 8 mm a 5 mm noncalcified nodule left lower lobe posteriorly.  Sagittal images of the spine shows diffuse osteopenia. Mild degenerative changes lower thoracic spine.  Mild hepatic fatty infiltration. No calcified gallstones are noted within gallbladder. Atrophic pancreas. Coarse calcification in pancreatic head region measures 1.6 cm. Coarse calcification in body of the pancreas measures 1.2 cm. 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 There is no evidence of acute pancreatitis. The spleen and adrenal glands are unremarkable. Abdominal aorta is unremarkable. Small hiatal hernia.  There is a small left paraumbilical hernia containing fat measures 1.7 cm without evidence of acute complication. Mild distension of mid small bowel. Some fecal like material noted in distal small bowel see axial image 66. Findings may reflect ileus or partial small bowel obstruction.  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  Kidneys are symmetrical in size and enhancement. No hydronephrosis or hydroureter.  Delayed renal images shows bilateral renal symmetrical excretion. Bilateral visualized proximal ureter is unremarkable.  The terminal ileum is unremarkable. Few diverticula are noted in sigmoid colon. No evidence of acute diverticulitis. Probable partial resection of sigmoid colon. No evidence of colitis or diverticulitis. Moderate stool noted in distal sigmoid colon and rectum. Small diverticulum containing air noted in distal duodenum.  Atrophic retroflexed uterus.  IMPRESSION: 1. No acute inflammatory process within abdomen. 2. There is fatty infiltration of the liver. Coarse calcifications in pancreatic head region and pancreatic body. 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 Mild atrophic pancreas. There is no evidence of acute pancreatitis. 3. No calcified gallstones are noted within gallbladder. There is mild distension of mid small bowel with some contrast material without air-fluid levels. Findings may reflect ileus or early partial bowel obstruction. There is some fecal like material in distal small bowel see axial image 66. Small left paraumbilical ventral hernia containing fat without evidence of acute complication. 4. Few diverticula are noted sigmoid colon. No evidence of acute diverticulitis. Moderate stool noted in distal sigmoid colon and rectum. 5. There is 8 mm noncalcified nodule in left lower lobe posteriorly. If the patient is at high risk  for bronchogenic carcinoma, follow-up chest CT at 3-676months is recommended. If the patient is at low risk for bronchogenic carcinoma, follow-up chest CT at 6-12 months is recommended. This recommendation follows the consensus statement: Guidelines for Management of Small Pulmonary Nodules Detected on CT Scans: A Statement from the Fleischner Society as published in Radiology 2005; 237:395-400.   Electronically Signed   By: Natasha MeadLiviu  Pop M.D.   On: 03/07/2015 08:45     EKG Interpretation   Date/Time:  Wednesday March 07 2015 05:21:52  EDT Ventricular Rate:  73 PR Interval:  156 QRS Duration: 70 QT Interval:  394 QTC Calculation: 434 R Axis:   34 Text Interpretation:  Normal sinus rhythm Nonspecific T wave abnormality  No significant change since last tracing Confirmed by Lashayla Armes  MD,  Justice Aguirre ([id number]) on 03/07/2015 5:28:20 AM      MDM   Final diagnoses:  Epigastric pain  Partial bowel obstruction    5:17 AM 64 y.o. female who presents with abdominal pain which began yesterday evening. She notes a cramping epigastric pain which radiates to her back. She states that she has a history of surgery due to diverticulitis. She is also had some dark appearing emesis and questionable blood in the emesis. She denies any blood in her stools or dark stools. Stool is brown on my exam without evidence of blood. She got 100 g of fentanyl in route and currently denies any pain or nausea. Vital signs unremarkable here. We'll get screening labs and imaging.  Hemeoccult neg.   Transferred care to Dr. Manus Gunningancour while awaiting CT scan of abdomen.  Purvis SheffieldForrest Jasman Pfeifle, MD 03/08/15 [phone]

## 2015-03-07 NOTE — ED Notes (Signed)
Attempted report. Floor states" Will call back".

## 2015-03-07 NOTE — ED Notes (Signed)
Per EMS, patient reports generalized abominal pain that radiates to back that started last night around 9pm.  Reports nausea and vomiting with coffee ground color. 12 lead unremarkable, 18g in right AC, of normal saline infused prior to arrival. BP 126/80, p 74, o2 sat 98% on room air, cbg 198, temp 98.0 with ems.

## 2015-03-07 NOTE — ED Notes (Signed)
Called CT to report patient finished contrast.

## 2015-03-07 NOTE — ED Notes (Signed)
Spoke with CT states they should be getting patient shortly.

## 2015-03-07 NOTE — ED Notes (Signed)
 of fentanyl was given by ems for pain management. No pain at this time.

## 2015-03-07 NOTE — H&P (Signed)
Date: 03/07/2015               Patient Name:  [name] MRN: [id number]  DOB: 1951/06/17 Age / Sex: 64 y.o., female   PCP: Rebecka Apley, RN              Medical Service: Internal Medicine Teaching Service              Attending Physician: Dr. Aletta Edouard, MD    First Contact: Dr. Danella Penton Pager: [phone]  Second Contact: Dr. Mikey Bussing Pager: [phone]            After Hours (After 5p/  First Contact Pager: [phone]  weekends / holidays): Second Contact Pager: [phone]   Chief Complaint: Epigastric pain for one day.   History of Present Illness: This is a 64 year old woman from [place], [place], with past medical history of seizure disorder, ? migraine headaches, hypothyroidism, history of diverticulitis with abscess formation in 2007, history of colostomy s/p reversal sometime in 2007 (careeverywhere there is a mention of small bowel surgeries but no details available), who presents the [facility] with one-day history of epigastric pain. Patient states that her epigastric pain started suddenly at 9:30 PM last night. It's been intermittent with increased frequence and duration since onset. She describes her pain to be cramping in nature with a severity of 10/10, radiating to the back, without relieving or exacerbating factors. She reports nausea and vomiting several times since onset of the pain. The emesis is nonbloody, nonbilious, but she reports that it appeared dark. Her last bowel movement was last night and it was nonbloody, without melena. She states that she has regular bowel movements. She is passing flatus normally. At the time of my evaluation, she had received pain medications and her pain had reduced to 1/10. She denies fevers or chills. No urinary symptoms. Due to her headaches she has tried several NSAIDs including Goody powders and Advil. No prior GI endoscopic evaluations, no colonoscopy. Abdominal CT scan in the ED revealed coarse calcifications of the pancreas; possible ileus  or early partial bowel obstruction and a small periumbilical hernia without complications.  Review of Systems: Constitutional: No fever, chills, diaphoresis, appetite change and fatigue.  HEENT: No URI symptoms. No neck pain or photophobia Respiratory: No SOB, DOE, cough, chest tightness, and wheezing. No hemoptysis.  Cardiovascular: No chest pain, palpitations and leg swelling. No PND or Orthopnea.  Genitourinary: No dysuria, urgency, frequency, hematuria, flank pain and difficulty urinating.  Musculoskeletal: No myalgias, back pain, joint swelling, arthralgias and gait problem.  Skin: No rash and wound. No easy bruising. Neurological: No recent seizures. Complaining of headaches which feels like her usual migraine headaches. No dizziness,, syncope, weakness, LH or numbness Psychiatric/Behavioral: No SI, mood changes, confusion, nervousness, sleep disturbance and agitation  Outpatient Meds:  Medications Prior to Admission  Medication Sig Dispense Refill  . levETIRAcetam (KEPPRA) 500 MG tablet Take 500 mg by mouth 3 (three) times daily.     Marland Kitchen levothyroxine (SYNTHROID, LEVOTHROID) 88 MCG tablet Take 88 mcg by mouth daily before breakfast.   1  . ondansetron (ZOFRAN) 8 MG tablet Take 8 mg by mouth every 8 (eight) hours as needed for nausea.   0  . promethazine (PHENERGAN) 25 MG tablet Take 25 mg by mouth every 6 (six) hours as needed for nausea or vomiting.    . rizatriptan (MAXALT-MLT) 10 MG disintegrating tablet Take 10 mg by mouth as needed for migraine.   1  . Vitamin D,  Ergocalciferol, (DRISDOL) 50000 UNITS CAPS capsule Take 50,000 Units by mouth every 7 (seven) days. On Thursdays  3     Allergies: Allergies as of 03/07/2015 - Review Complete 03/07/2015  Allergen Reaction Noted  . Atorvastatin Other (See Comments) 03/07/2015  . Pollen extract  03/07/2015   Past Medical History  Diagnosis Date  . Seizures     i've had "some sort of seizures"  . Diverticulitis   . Headache  disorder     being treated as migraine  . Hypothyroidism   . Heart burn    Past Surgical History  Procedure Laterality Date  . Appendectomy    . Colostomy reversal      possibly around 2007   History reviewed. No pertinent family history. History   Social History  . Marital Status: Single    Spouse Name: N/A  . Number of Children: N/A  . Years of Education: N/A   Occupational History  . cotton spinning    Social History Main Topics  . Smoking status: Former Smoker -- 0.50 packs/day for 22 years    Types: Cigarettes    Quit date: 03/06/2014  . Smokeless tobacco: Not on file  . Alcohol Use: No  . Drug Use: No  . Sexual Activity: Not on file   Other Topics Concern  . Not on file   Social History Narrative   Married, no children. 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    No alcohol     Physical Exam: Blood pressure 117/74, pulse 64, temperature 97.9 F (36.6 C), temperature source Oral, resp. rate 19, height 5\' 6"  (1.676 m), weight 179 lb 10.8 oz (81.5 kg), SpO2 98 %.  General: well developed, well nourished; no acute distress, cooperative with exam HEENT:  Neck is supple. Head is Atraumatic. Normal EOM. Pupils equal, round and reactive; anicteric. Nose/throat: Edentulous, has dentures at home. Oropharynx clear, moist mucous membranes, pink gums  Lungs/Chest wall: CTA bil, normal work of breathing  Heart: normal RRR; no murmurs. No JVD Pulses: radial and dorsalis pedis pulses are 2+ and symmetric  Abdomen: Midline surgical scar which is well healed. Slightly increased bowel sounds. Mild epigastric tenderness. Normal fullness, no rebound, guarding, or rigidity; no palpable masses.  Skin: warm, dry, intact, normal turgor, no rashes  Extremities: no peripheral edema, clubbing, or cyanosis Neurologic: A&O X3, CN II - XII are grossly intact. Strength 5/5 in the all 4 extremities, Sensation grossly intact. Psych: Normal mood and affect. Normal  speech and behavior. No agitation   Lab results: Basic Metabolic Panel:  Recent Labs  16/09/9602/30/16 0534  NA 140  K 4.4  CL 105  CO2 24  GLUCOSE 142*  BUN 8  CREATININE 0.91  CALCIUM 9.6   Liver Function Tests:  Recent Labs  03/07/15 0534  AST 26  ALT 25  ALKPHOS 96  BILITOT 0.5  PROT 6.5  ALBUMIN 3.7    Recent Labs  03/07/15 0534  LIPASE 20   CBC:  Recent Labs  03/07/15 0534  WBC 14.7*  NEUTROABS 11.9*  HGB 16.2*  HCT 47.8*  MCV 93.5  PLT 278   Urinalysis:  Recent Labs  03/07/15 0650  COLORURINE AMBER*  LABSPEC 1.027  PHURINE 5.0  GLUCOSEU NEGATIVE  HGBUR NEGATIVE  BILIRUBINUR SMALL*  KETONESUR 15*  PROTEINUR 30*  UROBILINOGEN 0.2  NITRITE NEGATIVE  LEUKOCYTESUR NEGATIVE   Imaging results:  Ct Abdomen Pelvis W Contrast  03/07/2015  CLINICAL DATA:  Epigastric pain, back pain, history of bowel resection  EXAM: CT ABDOMEN AND PELVIS WITH CONTRAST  TECHNIQUE: Multidetector CT imaging of the abdomen and pelvis was performed using the standard protocol following bolus administration of intravenous contrast.  CONTRAST:  OMNIPAQUE IOHEXOL 300 MG/ML  SOLN  COMPARISON:  02/16/2007  FINDINGS: Lung bases shows 8 mm a 5 mm noncalcified nodule left lower lobe posteriorly.  Sagittal images of the spine shows diffuse osteopenia. Mild degenerative changes lower thoracic spine.  Mild hepatic fatty infiltration. No calcified gallstones are noted within gallbladder. Atrophic pancreas. Coarse calcification in pancreatic head region measures 1.6 cm. Coarse calcification in body of the pancreas measures 1.2 cm. 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 There is no evidence of acute pancreatitis. The spleen and adrenal glands are unremarkable. Abdominal aorta is unremarkable. Small hiatal hernia.  There is a small left paraumbilical hernia containing fat measures 1.7 cm without evidence of acute complication. Mild distension of mid small bowel. Some fecal  like material noted in distal small bowel see axial image 66. Findings may reflect ileus or partial small bowel obstruction.  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  Kidneys are symmetrical in size and enhancement. No hydronephrosis or hydroureter. Delayed renal images shows bilateral renal symmetrical excretion. Bilateral visualized proximal ureter is unremarkable.  The terminal ileum is unremarkable. Few diverticula are noted in sigmoid colon. No evidence of acute diverticulitis. Probable partial resection of sigmoid colon. No evidence of colitis or diverticulitis. Moderate stool noted in distal sigmoid colon and rectum. Small diverticulum containing air noted in distal duodenum.  Atrophic retroflexed uterus.  IMPRESSION: 1. No acute inflammatory process within abdomen. 2. There is fatty infiltration of the liver. Coarse calcifications in pancreatic head region and pancreatic body. 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 Mild atrophic pancreas. There is no evidence of acute pancreatitis. 3. No calcified gallstones are noted within gallbladder. There is mild distension of mid small bowel with some contrast material without air-fluid levels. Findings may reflect ileus or early partial bowel obstruction. There is some fecal like material in distal small bowel see axial image 66. Small left paraumbilical ventral hernia containing fat without evidence of acute complication. 4. Few diverticula are noted sigmoid colon. No evidence of acute diverticulitis. Moderate stool noted in distal sigmoid colon and rectum. 5. There is 8 mm noncalcified nodule in left lower lobe posteriorly. If the patient is at high risk for bronchogenic carcinoma, follow-up chest CT at 3-49months is recommended. If the patient is at low risk for bronchogenic carcinoma, follow-up chest CT at 6-12 months is recommended. This recommendation follows the consensus statement: Guidelines for Management  of Small Pulmonary Nodules Detected on CT Scans: A Statement from the Fleischner Society as published in Radiology 2005; 237:395-400.   Electronically Signed   By: Natasha Mead M.D.   On: 03/07/2015 08:45    Other results: EKG: normal EKG, normal sinus rhythm, nonspecific ST and T waves changes.  Assessment & Plan by Problem: Principal Problem:   Partial bowel obstruction Active Problems:   Seizures   Hypothyroidism   Headache disorder   Pulmonary nodule, left   This is a 64 year old woman with past medical history of abdominal surgeries, diverticulitis, hypothyroidism and migraine headaches who presents with epigastric pain with nausea and vomiting concerning for partial SBO.  Partial intestinal obstruction: Her presentation is most likely due to partial SBO versus ileus as revealed on a CT scan of  the abdomen. She also had some calcifications in the pancreas but her lipase is normal. She has no history of alcohol excessive use. She is not on any medication suspected to induce chronic pancreatitis. Given her prior history of abdominal surgeries (careeverywhere there is a mention of small bowel surgeries but no details available) her SBO is likely related to adhesions. Labs without electrolyte abnormalities to account for ileus. No anion gap on her BMP. She is afebrile and her leukocytosis likely due to white blood cell demargination. It will be monitored.   Plan -MedSurg bed under observation. -Conservative management for partial SBO. No indication for NG tube placement at this time, but can be considered if she continues to vomit  -NPO - IVF with NS 100cc/hr overnight -Pain management with IV morphine when necessary -Zofran for nausea and vomiting -Consider consulting general surgery if her symptoms worsen or do not resolve over the next 24-48 hours -May try GI cocktail if needed for epigastric pain  Migraine headaches: She takes Rizatriptan and various OTC NSAIDs. She complains of  headaches currently. No neurologic deficits. Plan  - trial of headache cocktail with IV Benadryl 25 mg, Compazine 10 mg and Toradol 30 mg  - this can be repeated if the headaches persist - cont with home Rizatriptan   Seizure disorder: Per her history her seizures were non-convulsive. She takes Keppra 500 mg 3 times a day. Will continue with this.  Hypothyroidism: Continue with home Synthroid.  Pulmonary nodule: Her CT scan revealed an 8 mm noncalcified nodule in the left lower lobe posteriorly. She does have a history of cigarette smoking with 22-pack-years, otherwise no family history of lung cancer. She quit in 2015. She is at a higher risk of bronchogenic carcinoma and she will need a follow-up chest CT in 3-6 months. This will be arranged at her discharge.  Code Status: FULL CODE  F/E/N: ivf, npo for now.   VTE Ppx: subq lovenox  Family Communication: I discussed with the patient about plan of care and all questions answered. Her husband was not present at the time.  Dispo: Disposition is deferred at this time, awaiting improvement of current medical problems. Anticipated discharge in approximately 1-2 day(s).   The patient does have a current PCP [facility], RN), therefore is not require [facility] follow-up after discharge.   The patient does not have transportation limitations that hinder transportation to clinic appointments.   Signed:  Dow Adolph, MD PGY-3 Internal Medicine Teaching Service Pager [phone]  03/07/2015, 10:27 AM

## 2015-03-07 NOTE — ED Notes (Signed)
Phlebotomy at the bedside  

## 2015-03-07 NOTE — ED Notes (Signed)
Dr. Harrison at the bedside. 

## 2015-03-07 NOTE — ED Provider Notes (Signed)
Care assumed in sign out from Dr. Romeo AppleHarrison.  Abdominal pain and vomiting since last night. CT pending.  CT shows chronic calcifications of the pancreas. There is some dilated small bowel loops and concern for ileus versus early partial obstruction.  Nausea persists.  Endorses typical headache. Admission d/w [facility] residents.  Glynn OctaveStephen Cornie Mccomber, MD 03/07/15 [id number]

## 2015-03-08 DIAGNOSIS — E039 Hypothyroidism, unspecified: Secondary | ICD-10-CM | POA: Diagnosis not present

## 2015-03-08 DIAGNOSIS — G40909 Epilepsy, unspecified, not intractable, without status epilepticus: Secondary | ICD-10-CM | POA: Diagnosis not present

## 2015-03-08 DIAGNOSIS — K5669 Other intestinal obstruction: Secondary | ICD-10-CM | POA: Diagnosis not present

## 2015-03-08 DIAGNOSIS — E559 Vitamin D deficiency, unspecified: Secondary | ICD-10-CM

## 2015-03-08 DIAGNOSIS — G43909 Migraine, unspecified, not intractable, without status migrainosus: Secondary | ICD-10-CM | POA: Diagnosis not present

## 2015-03-08 LAB — COMPREHENSIVE METABOLIC PANEL
ALT: 21 U/L (ref 0–35)
AST: 23 U/L (ref 0–37)
Albumin: 2.9 g/dL — ABNORMAL LOW (ref 3.5–5.2)
Alkaline Phosphatase: 79 U/L (ref 39–117)
Anion gap: 5 (ref 5–15)
BILIRUBIN TOTAL: 0.7 mg/dL (ref 0.3–1.2)
CHLORIDE: 109 mmol/L (ref 96–112)
CO2: 27 mmol/L (ref 19–32)
Calcium: 8.7 mg/dL (ref 8.4–10.5)
Creatinine, Ser: 0.74 mg/dL (ref 0.50–1.10)
GFR calc Af Amer: >90 mL/min (ref >90)
GFR calc non Af Amer: 89 mL/min — ABNORMAL LOW (ref >90)
Glucose, Bld: 97 mg/dL (ref 70–99)
Potassium: 4.4 mmol/L (ref 3.5–5.1)
Sodium: 141 mmol/L (ref 135–145)
TOTAL PROTEIN: 5.5 g/dL — AB (ref 6.0–8.3)

## 2015-03-08 LAB — CBC
HCT: 41.6 % (ref 36.0–46.0)
HEMOGLOBIN: 13.9 g/dL (ref 12.0–15.0)
MCH: 31.3 pg (ref 26.0–34.0)
MCHC: 33.4 g/dL (ref 30.0–36.0)
MCV: 93.7 fL (ref 78.0–100.0)
Platelets: 224 10*3/uL (ref 150–400)
RBC: 4.44 MIL/uL (ref 3.87–5.11)
RDW: 12.5 % (ref 11.5–15.5)
WBC: 6.2 10*3/uL (ref 4.0–10.5)

## 2015-03-08 LAB — PHOSPHORUS: Phosphorus: 3.6 mg/dL (ref 2.3–4.6)

## 2015-03-08 LAB — HIV ANTIBODY (ROUTINE TESTING W REFLEX): HIV Screen 4th Generation wRfx: NONREACTIVE

## 2015-03-08 LAB — LACTIC ACID, PLASMA: LACTIC ACID, VENOUS: 0.9 mmol/L (ref 0.5–2.0)

## 2015-03-08 LAB — MRSA PCR SCREENING: MRSA by PCR: NEGATIVE

## 2015-03-08 LAB — MAGNESIUM: Magnesium: 1.8 mg/dL (ref 1.5–2.5)

## 2015-03-08 NOTE — Progress Notes (Signed)
UR completed 

## 2015-03-08 NOTE — Discharge Instructions (Signed)
Please follow-up with your PCP as scheduled.   You should also follow-up with your neurologist.   If you experience new or worsening symptoms, including nausea, vomiting, belly pain, please call your physician or return to the ER.

## 2015-03-08 NOTE — Discharge Summary (Signed)
Name: [name] MRN: [id number] DOB: [birth year] 64 y.o. PCP: Bridget Hartshorn, RN  Date of Admission: 03/07/2015  5:08 AM Date of Discharge: 03/08/2015 Attending Physician: Madilyn Fireman, MD  Discharge Diagnosis:   Primary:  Partial intestinal obstruction  Migraine headaches Seizure disorder Hypothyroidism Vitamin D Deficiency Solitary Pulmonary nodule    Discharge Medications:   Medication List    TAKE these medications        levETIRAcetam 500 MG tablet  Commonly known as:  KEPPRA  Take 500 mg by mouth 3 (three) times daily.     levothyroxine 88 MCG tablet  Commonly known as:  SYNTHROID, LEVOTHROID  Take 88 mcg by mouth daily before breakfast.     ondansetron 8 MG tablet  Commonly known as:  ZOFRAN  Take 8 mg by mouth every 8 (eight) hours as needed for nausea.     promethazine 25 MG tablet  Commonly known as:  PHENERGAN  Take 25 mg by mouth every 6 (six) hours as needed for nausea or vomiting.     rizatriptan 10 MG disintegrating tablet  Commonly known as:  MAXALT-MLT  Take 10 mg by mouth as needed for migraine.     Vitamin D (Ergocalciferol) 50000 UNITS Caps capsule  Commonly known as:  DRISDOL  Take 50,000 Units by mouth every 7 (seven) days. On Thursdays        Disposition and follow-up:   [name] was discharged from [facility] in Good condition.  At the hospital follow up visit please address:  1.  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        Consider EGD if epigastric pain continues        Chronic migraine headache pain control        14m  solitary pulmonary nodule detected on CT in left lower-lobe. Please arrange for appropriate follow-up imaging in 3-6 months.  2.  Labs / imaging needed at time of follow-up: Keppra level, consider chronic pancreatitis work-up   3.  Pending labs/ test needing follow-up: none  Follow-up Appointments: Follow-up Information    [facility] RN On 03/15/2015.   Specialty:  Adult Health Nurse Practitioner   Why:  10AM   Contact information:   [place] [phone]      Follow up with Allison Amel MD On 03/13/2015.   Specialties:  Psychiatry, Neurology   Why:  1Insurance account managerinformation:   [place] [id number]      Discharge Instructions:  Please follow-up with your PCP as scheduled.   You should also follow-up with your neurologist.   If you experience new or worsening symptoms, including nausea, vomiting, belly pain, please call your physician or return to the ER.   Procedures Performed:  Ct Abdomen Pelvis W Contrast  03/07/2015   CLINICAL DATA:  Epigastric pain, back pain, history of bowel resection  EXAM: CT ABDOMEN AND PELVIS WITH CONTRAST  TECHNIQUE: Multidetector CT imaging of the abdomen and pelvis was performed using the standard protocol following bolus administration of intravenous contrast.  CONTRAST:  1045mOMNIPAQUE IOHEXOL 300 MG/ML  SOLN  COMPARISON:  02/16/2007  FINDINGS: Lung bases shows 8 mm a 5 mm noncalcified nodule left lower lobe posteriorly.  Sagittal images of the spine shows diffuse osteopenia. Mild degenerative changes lower thoracic spine.  Mild hepatic fatty infiltration. No calcified gallstones  are noted within gallbladder. Atrophic pancreas. Coarse calcification in pancreatic head region measures 1.6 cm. Coarse calcification in body of the pancreas measures 1.2 cm. 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 There is no evidence of acute pancreatitis. The spleen and adrenal glands are unremarkable. Abdominal aorta is unremarkable. Small hiatal hernia.  There is a small left paraumbilical hernia containing fat measures 1.7 cm without evidence of acute complication. Mild distension of mid small bowel. Some fecal like material noted in distal small bowel see axial image 66. Findings  may reflect ileus or partial small bowel obstruction.  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  Kidneys are symmetrical in size and enhancement. No hydronephrosis or hydroureter. Delayed renal images shows bilateral renal symmetrical excretion. Bilateral visualized proximal ureter is unremarkable.  The terminal ileum is unremarkable. Few diverticula are noted in sigmoid colon. No evidence of acute diverticulitis. Probable partial resection of sigmoid colon. No evidence of colitis or diverticulitis. Moderate stool noted in distal sigmoid colon and rectum. Small diverticulum containing air noted in distal duodenum.  Atrophic retroflexed uterus.  IMPRESSION: 1. No acute inflammatory process within abdomen. 2. There is fatty infiltration of the liver. Coarse calcifications in pancreatic head region and pancreatic body. 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 Mild atrophic pancreas. There is no evidence of acute pancreatitis. 3. No calcified gallstones are noted within gallbladder. There is mild distension of mid small bowel with some contrast material without air-fluid levels. Findings may reflect ileus or early partial bowel obstruction. There is some fecal like material in distal small bowel see axial image 66. Small left paraumbilical ventral hernia containing fat without evidence of acute complication. 4. Few diverticula are noted sigmoid colon. No evidence of acute diverticulitis. Moderate stool noted in distal sigmoid colon and rectum. 5. There is 8 mm noncalcified nodule in left lower lobe posteriorly. If the patient is at high risk for bronchogenic carcinoma, follow-up chest CT at 3-53month is recommended. If the patient is at low risk for bronchogenic carcinoma, follow-up chest CT at 6-12 months is recommended. This recommendation follows the consensus statement: Guidelines for Management of Small Pulmonary Nodules Detected on CT Scans: A Statement from the  [employer] published in Radiology 2005; 237:395-400.   Electronically Signed   By: LLahoma CrockerM.D.   On: 03/07/2015 08:45    Admission HPI: This is [place] 64[place] past medical history of seizure disorder, ? migraine headaches, hypothyroidism, history of diverticulitis with abscess formation in 2007, history of colostomy s/p reversal sometime in 2007 (careeverywhere there is a mention of small bowel surgeries but no details available), who presents the [facility] with one-day history of epigastric pain. Patient states that her epigastric pain started suddenly at 9:30 PM last night. It's been intermittent with increased frequence and duration since onset. She describes her pain to be cramping in nature with a severity of 10/10, radiating to the back, without relieving or exacerbating factors. She reports nausea and vomiting several times since onset of the pain. The emesis is nonbloody, nonbilious, but she reports that it appeared dark. Her last bowel movement was last night and it was nonbloody, without melena. She states that she has regular bowel movements. She is passing flatus normally. At the time of my evaluation, she had received pain medications and her pain had reduced to 1/10. She denies fevers or chills. No urinary symptoms. Due to her headaches she has tried several NSAIDs including  Goody powders and Advil. No prior GI endoscopic evaluations, no colonoscopy. Abdominal CT scan in the ED revealed coarse calcifications of the pancreas; possible ileus or early partial bowel obstruction and a small periumbilical hernia without complications.  Hospital Course by problem list: Principal Problem:   Partial bowel obstruction Active Problems:   Seizures   Hypothyroidism   Headache disorder   Pulmonary nodule, left   Vitamin D deficiency   Prediabetes   Hyperlipidemia   Partial intestinal obstruction: Patient presentation is most likely due to partial SBO versus  ileus as revealed on a CT scan of the abdomen. She also had some calcifications in the pancreas but her lipase is normal and has no history of alcohol excessive use, making acute pancreatitis less likely. She is not on any medication suspected to induce chronic pancreatitis. Given her prior history of abdominal surgeries, including colostomy in 2007 and remote appendectomy, her SBO is likely related to adhesions. Labs without electrolyte abnormalities to account for ileus. No anion gap on her BMP. She is afebrile and her leukocytosis likely due to white blood cell demargination. She received conservative management for partial SBO, started fluids and made NPO but no indication for NG tube placement in absence of ongoing emesis. She was passing flatus on admission. Reports a BM on AM of admission with appetite. Was advanced to clear liquid diet which she tolerated well and so was advanced to regular diet at lunch. Tolerated her meal without nausea or vomiting and denies any abdominal pain. Additional work-up could include ESR, IgG4, RF, ANA, and anti-smooth muscle antibody titer for possible autoimmune chronic pancreatitis; fecal elastase test for evaluating pancreatic exocrine dysfunction; vitamin B12 and folate may also be indicated. We recommend further evaluation and work-up for possible chronic pancreatitis in light of CT findings. Patient has been found to have low Vitamin D level, and pre-diabetes based on A1C of 6, possibly indicating endocrine dysfunction. Pt has never had EGD and should be considered if she continues to have epigastric pain to assess for PUD.   Chronic Migraine headaches: She takes Rizatriptan and various OTC NSAIDs. She complains of headaches periodically throughout admission, temporarily relieved with acetaminophen and headache cocktail of IV Benadryl 25 mg, Compazine 10 mg and Toradol 30 mg . Patient has no neurologic deficits. We spoke to the patient's neurologist regarding any  contraindications to continued use of Rizatriptan and Keppra - no contraindications to continuation of both medications. We reccommended OTC naproxen as needed for pain control. She will have a follow-up appointment with her neurologist, Dr. Linus Salmons, on 03/13/2015.  Seizure disorder: Per her history her seizures were non-convulsive. She takes Keppra 500 mg 3 times a day. This was continued throughout her hospitalization. 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   Hypothyroidism: Last TSH level 0.415 on 02/20/15. Pt was continued on 88 mcg synthroid daily.  Vitamin D Deficiency - Pt with 25-OH vitamin D level of 10.2 on 02/20/15. Pt was continued on ergocalciferol 50K U weekly.    Solitary Pulmonary nodule: Her CT scan revealed an 8 mm noncalcified nodule in the left lower lobe posteriorly. She does have a history of cigarette smoking with 22-pack-years, otherwise no family history of lung cancer. She quit in 2015. She is at a higher risk of bronchogenic carcinoma and she will need a follow-up chest CT in 3-6 months. Her PCP will arrange for follow-up imaging as indicated.    Discharge Vitals:   BP 121/82 mmHg  Pulse 58  Temp(Src) 98.1 F (36.7 C) (Oral)  Resp 16  Ht 5' 6" (1.676 m)  Wt 81.5 kg (179 lb 10.8 oz)  BMI 29.01 kg/m2  SpO2 98%  LMP  (LMP Unknown)  Discharge Labs:  Results for orders placed or performed during the hospital encounter of 03/07/15 (from the past 24 hour(s))  MRSA PCR Screening     Status: None   Collection Time: 03/07/15 11:35 PM  Result Value Ref Range   MRSA by PCR NEGATIVE NEGATIVE  Comprehensive metabolic panel     Status: Abnormal   Collection Time: 03/08/15  5:42 AM  Result Value Ref Range   Sodium 141 135 - 145 mmol/L   Potassium 4.4 3.5 - 5.1 mmol/L   Chloride 109 96 - 112 mmol/L   CO2 27 19 - 32 mmol/L   Glucose, Bld 97 70 - 99 mg/dL   BUN <5 (L) 6 - 23 mg/dL   Creatinine, Ser 0.74 0.50 - 1.10 mg/dL   Calcium 8.7 8.4 -  10.5 mg/dL   Total Protein 5.5 (L) 6.0 - 8.3 g/dL   Albumin 2.9 (L) 3.5 - 5.2 g/dL   AST 23 0 - 37 U/L   ALT 21 0 - 35 U/L   Alkaline Phosphatase 79 39 - 117 U/L   Total Bilirubin 0.7 0.3 - 1.2 mg/dL   GFR calc non Af Amer 89 (L) >90 mL/min   GFR calc Af Amer >90 >90 mL/min   Anion gap 5 5 - 15  CBC     Status: None   Collection Time: 03/08/15  5:42 AM  Result Value Ref Range   WBC 6.2 4.0 - 10.5 K/uL   RBC 4.44 3.87 - 5.11 MIL/uL   Hemoglobin 13.9 12.0 - 15.0 g/dL   HCT 41.6 36.0 - 46.0 %   MCV 93.7 78.0 - 100.0 fL   MCH 31.3 26.0 - 34.0 pg   MCHC 33.4 30.0 - 36.0 g/dL   RDW 12.5 11.5 - 15.5 %   Platelets 224 150 - 400 K/uL  Magnesium     Status: None   Collection Time: 03/08/15  5:42 AM  Result Value Ref Range   Magnesium 1.8 1.5 - 2.5 mg/dL  Phosphorus     Status: None   Collection Time: 03/08/15  5:42 AM  Result Value Ref Range   Phosphorus 3.6 2.3 - 4.6 mg/dL  HIV antibody     Status: None   Collection Time: 03/08/15  5:42 AM  Result Value Ref Range   HIV Screen 4th Generation wRfx Non Reactive Non Reactive  Lactic acid, plasma     Status: None   Collection Time: 03/08/15  5:42 AM  Result Value Ref Range   Lactic Acid, Venous 0.9 0.5 - 2.0 mmol/L    Signed:  Juluis Mire, MD PGY-II IMTS  [place] Ordered on Discharge: None Equipment Ordered on Discharge: None

## 2015-03-08 NOTE — Progress Notes (Addendum)
Subjective: Patient reports improved abdominal pain, denying any nausea or vomiting since the ED and continuing to report flatus now with increased appetite. She is complaining of a headache similar to that which she has had previously. Otherwise, patient is feeling well.   Objective: Vital signs in last 24 hours: Filed Vitals:   03/07/15 2122 03/08/15 0640 03/08/15 0952 03/08/15 1300  BP: 95/63 113/73 115/89 121/82  Pulse: 58 61 94 58  Temp: 98.1 F (36.7 C) 98.2 F (36.8 C) 98.4 F (36.9 C) 98.1 F (36.7 C)  TempSrc: Oral  Oral Oral  Resp: 15 16 16 16   Height:      Weight:      SpO2: 93% 95% 97% 98%   Weight change:   Intake/Output Summary (Last 24 hours) at 03/08/15 1512 Last data filed at 03/08/15 1120  Gross per 24 hour  Intake   1027 ml  Output   2201 ml  Net  -1174 ml   General: resting in bed HEENT: PERRL, EOMI, no scleral icterus Cardiac: RRR, no rubs, murmurs or gallops Pulm: clear to auscultation bilaterally, moving normal volumes of air Abd: soft, nontender, nondistended, increased BS present Ext: warm and well perfused, no pedal edema Neuro: alert and oriented X3, cranial nerves II-XII grossly intact  Lab Results: Recent Results (from the past 2160 hour(s))  Occult blood card to lab, stool Provider will collect     Status: None   Collection Time: 03/07/15  5:17 AM  Result Value Ref Range   Fecal Occult Bld NEGATIVE NEGATIVE  CBC with Differential/Platelet     Status: Abnormal   Collection Time: 03/07/15  5:34 AM  Result Value Ref Range   WBC 14.7 (H) 4.0 - 10.5 K/uL   RBC 5.11 3.87 - 5.11 MIL/uL   Hemoglobin 16.2 (H) 12.0 - 15.0 g/dL   HCT 47.8 (H) 36.0 - 46.0 %   MCV 93.5 78.0 - 100.0 fL   MCH 31.7 26.0 - 34.0 pg   MCHC 33.9 30.0 - 36.0 g/dL   RDW 12.4 11.5 - 15.5 %   Platelets 278 150 - 400 K/uL   Neutrophils Relative % 80 (H) 43 - 77 %   Neutro Abs 11.9 (H) 1.7 - 7.7 K/uL   Lymphocytes Relative 14 12 - 46 %   Lymphs Abs 2.0 0.7 - 4.0 K/uL     Monocytes Relative 5 3 - 12 %   Monocytes Absolute 0.7 0.1 - 1.0 K/uL   Eosinophils Relative 1 0 - 5 %   Eosinophils Absolute 0.1 0.0 - 0.7 K/uL   Basophils Relative 0 0 - 1 %   Basophils Absolute 0.0 0.0 - 0.1 K/uL  Comprehensive metabolic panel     Status: Abnormal   Collection Time: 03/07/15  5:34 AM  Result Value Ref Range   Sodium 140 135 - 145 mmol/L   Potassium 4.4 3.5 - 5.1 mmol/L   Chloride 105 96 - 112 mmol/L   CO2 24 19 - 32 mmol/L   Glucose, Bld 142 (H) 70 - 99 mg/dL   BUN 8 6 - 23 mg/dL   Creatinine, Ser 0.91 0.50 - 1.10 mg/dL   Calcium 9.6 8.4 - 10.5 mg/dL   Total Protein 6.5 6.0 - 8.3 g/dL   Albumin 3.7 3.5 - 5.2 g/dL   AST 26 0 - 37 U/L   ALT 25 0 - 35 U/L   Alkaline Phosphatase 96 39 - 117 U/L   Total Bilirubin 0.5 0.3 - 1.2 mg/dL  GFR calc non Af Amer 66 (L) >90 mL/min   GFR calc Af Amer 77 (L) >90 mL/min    Comment: (NOTE) The eGFR has been calculated using the CKD EPI equation. This calculation has not been validated in all clinical situations. eGFR's persistently <90 mL/min signify possible Chronic Kidney Disease.    Anion gap 11 5 - 15  Lipase, blood     Status: None   Collection Time: 03/07/15  5:34 AM  Result Value Ref Range   Lipase 20 11 - 59 U/L  Urinalysis, Routine w reflex microscopic     Status: Abnormal   Collection Time: 03/07/15  6:50 AM  Result Value Ref Range   Color, Urine AMBER (A) YELLOW    Comment: BIOCHEMICALS MAY BE AFFECTED BY COLOR   APPearance TURBID (A) CLEAR   Specific Gravity, Urine 1.027 1.005 - 1.030   pH 5.0 5.0 - 8.0   Glucose, UA NEGATIVE NEGATIVE mg/dL   Hgb urine dipstick NEGATIVE NEGATIVE   Bilirubin Urine SMALL (A) NEGATIVE   Ketones, ur 15 (A) NEGATIVE mg/dL   Protein, ur 30 (A) NEGATIVE mg/dL   Urobilinogen, UA 0.2 0.0 - 1.0 mg/dL   Nitrite NEGATIVE NEGATIVE   Leukocytes, UA NEGATIVE NEGATIVE  Urine microscopic-add on     Status: Abnormal   Collection Time: 03/07/15  6:50 AM  Result Value Ref Range    Squamous Epithelial / LPF RARE RARE   WBC, UA 0-2 <3 WBC/hpf   Bacteria, UA FEW (A) RARE   Crystals CA OXALATE CRYSTALS (A) NEGATIVE   Urine-Other MUCOUS PRESENT   I-stat troponin, ED     Status: None   Collection Time: 03/07/15  7:31 AM  Result Value Ref Range   Troponin i, poc 0.00 0.00 - 0.08 ng/mL   Comment 3            Comment: Due to the release kinetics of cTnI, a negative result within the first hours of the onset of symptoms does not rule out myocardial infarction with certainty. If myocardial infarction is still suspected, repeat the test at appropriate intervals.   MRSA PCR Screening     Status: None   Collection Time: 03/07/15 11:35 PM  Result Value Ref Range   MRSA by PCR NEGATIVE NEGATIVE    Comment:        The GeneXpert MRSA Assay (FDA approved for NASAL specimens only), is one component of a comprehensive MRSA colonization surveillance program. It is not intended to diagnose MRSA infection nor to guide or monitor treatment for MRSA infections.   Comprehensive metabolic panel     Status: Abnormal   Collection Time: 03/08/15  5:42 AM  Result Value Ref Range   Sodium 141 135 - 145 mmol/L   Potassium 4.4 3.5 - 5.1 mmol/L   Chloride 109 96 - 112 mmol/L   CO2 27 19 - 32 mmol/L   Glucose, Bld 97 70 - 99 mg/dL   BUN <5 (L) 6 - 23 mg/dL   Creatinine, Ser 0.74 0.50 - 1.10 mg/dL   Calcium 8.7 8.4 - 10.5 mg/dL   Total Protein 5.5 (L) 6.0 - 8.3 g/dL   Albumin 2.9 (L) 3.5 - 5.2 g/dL   AST 23 0 - 37 U/L   ALT 21 0 - 35 U/L   Alkaline Phosphatase 79 39 - 117 U/L   Total Bilirubin 0.7 0.3 - 1.2 mg/dL   GFR calc non Af Amer 89 (L) >90 mL/min   GFR calc Af Amer >  90 >90 mL/min    Comment: (NOTE) The eGFR has been calculated using the CKD EPI equation. This calculation has not been validated in all clinical situations. eGFR's persistently <90 mL/min signify possible Chronic Kidney Disease.    Anion gap 5 5 - 15  CBC     Status: None   Collection Time: 03/08/15   5:42 AM  Result Value Ref Range   WBC 6.2 4.0 - 10.5 K/uL   RBC 4.44 3.87 - 5.11 MIL/uL   Hemoglobin 13.9 12.0 - 15.0 g/dL   HCT 41.6 36.0 - 46.0 %   MCV 93.7 78.0 - 100.0 fL   MCH 31.3 26.0 - 34.0 pg   MCHC 33.4 30.0 - 36.0 g/dL   RDW 12.5 11.5 - 15.5 %   Platelets 224 150 - 400 K/uL  Magnesium     Status: None   Collection Time: 03/08/15  5:42 AM  Result Value Ref Range   Magnesium 1.8 1.5 - 2.5 mg/dL  Phosphorus     Status: None   Collection Time: 03/08/15  5:42 AM  Result Value Ref Range   Phosphorus 3.6 2.3 - 4.6 mg/dL  HIV antibody     Status: None   Collection Time: 03/08/15  5:42 AM  Result Value Ref Range   HIV Screen 4th Generation wRfx Non Reactive Non Reactive    Comment: (NOTE) Performed At: [facility] [address] [place], [place] [id number] Lindon Romp MD IN:[id number]   Lactic acid, plasma     Status: None   Collection Time: 03/08/15  5:42 AM  Result Value Ref Range   Lactic Acid, Venous 0.9 0.5 - 2.0 mmol/L    Micro Results: Recent Results (from the past 240 hour(s))  MRSA PCR Screening     Status: None   Collection Time: 03/07/15 11:35 PM  Result Value Ref Range Status   MRSA by PCR NEGATIVE NEGATIVE Final    Comment:        The GeneXpert MRSA Assay (FDA approved for NASAL specimens only), is one component of a comprehensive MRSA colonization surveillance program. It is not intended to diagnose MRSA infection nor to guide or monitor treatment for MRSA infections.    Studies/Results: Ct Abdomen Pelvis W Contrast  03/07/2015   CLINICAL DATA:  Epigastric pain, back pain, history of bowel resection  EXAM: CT ABDOMEN AND PELVIS WITH CONTRAST  TECHNIQUE: Multidetector CT imaging of the abdomen and pelvis was performed using the standard protocol following bolus administration of intravenous contrast.  CONTRAST:  129m OMNIPAQUE IOHEXOL 300 MG/ML  SOLN  COMPARISON:  02/16/2007  FINDINGS: Lung bases shows 8 mm a 5 mm noncalcified nodule  left lower lobe posteriorly.  Sagittal images of the spine shows diffuse osteopenia. Mild degenerative changes lower thoracic spine.  Mild hepatic fatty infiltration. No calcified gallstones are noted within gallbladder. Atrophic pancreas. Coarse calcification in pancreatic head region measures 1.6 cm. Coarse calcification in body of the pancreas measures 1.2 cm. 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 There is no evidence of acute pancreatitis. The spleen and adrenal glands are unremarkable. Abdominal aorta is unremarkable. Small hiatal hernia.  There is a small left paraumbilical hernia containing fat measures 1.7 cm without evidence of acute complication. Mild distension of mid small bowel. Some fecal like material noted in distal small bowel see axial image 66. Findings may reflect ileus or partial small bowel obstruction.  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  Kidneys are  symmetrical in size and enhancement. No hydronephrosis or hydroureter. Delayed renal images shows bilateral renal symmetrical excretion. Bilateral visualized proximal ureter is unremarkable.  The terminal ileum is unremarkable. Few diverticula are noted in sigmoid colon. No evidence of acute diverticulitis. Probable partial resection of sigmoid colon. No evidence of colitis or diverticulitis. Moderate stool noted in distal sigmoid colon and rectum. Small diverticulum containing air noted in distal duodenum.  Atrophic retroflexed uterus.  IMPRESSION: 1. No acute inflammatory process within abdomen. 2. There is fatty infiltration of the liver. Coarse calcifications in pancreatic head region and pancreatic body. 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 Mild atrophic pancreas. There is no evidence of acute pancreatitis. 3. No calcified gallstones are noted within gallbladder. There is mild distension of mid small bowel with some contrast material without air-fluid levels.  Findings may reflect ileus or early partial bowel obstruction. There is some fecal like material in distal small bowel see axial image 66. Small left paraumbilical ventral hernia containing fat without evidence of acute complication. 4. Few diverticula are noted sigmoid colon. No evidence of acute diverticulitis. Moderate stool noted in distal sigmoid colon and rectum. 5. There is 8 mm noncalcified nodule in left lower lobe posteriorly. If the patient is at high risk for bronchogenic carcinoma, follow-up chest CT at 3-67month is recommended. If the patient is at low risk for bronchogenic carcinoma, follow-up chest CT at 6-12 months is recommended. This recommendation follows the consensus statement: Guidelines for Management of Small Pulmonary Nodules Detected on CT Scans: A Statement from the [place] published in Radiology 2005; 237:395-400.   Electronically Signed   By: LLahoma CrockerM.D.   On: 03/07/2015 08:45   Medications: I have reviewed the patient's current medications. Scheduled Meds: . antiseptic oral rinse  7 mL Mouth Rinse q12n4p  . chlorhexidine  15 mL Mouth Rinse BID  . enoxaparin (LOVENOX) injection  40 mg Subcutaneous Q24H  . levETIRAcetam  500 mg Oral TID  . levothyroxine  88 mcg Oral QAC breakfast  . Vitamin D (Ergocalciferol)  50,000 Units Oral Q7 days   Continuous Infusions: . sodium chloride 100 mL/hr at 03/07/15 1157   PRN Meds:.acetaminophen, morphine injection, ondansetron (ZOFRAN) IV, SUMAtriptan Assessment/Plan: Principal Problem:   Partial bowel obstruction Active Problems:   Seizures   Hypothyroidism   Headache disorder   Pulmonary nodule, left   Vitamin D deficiency   Prediabetes   Hyperlipidemia  This is a 64year old woman with past medical history of abdominal surgeries, diverticulitis, hypothyroidism and migraine headaches who presents with epigastric pain with nausea and vomiting concerning for partial SBO.  Partial intestinal obstruction: Her  presentation is most likely due to partial SBO versus ileus as revealed on a CT scan of the abdomen. She also had some calcifications in the pancreas but her lipase is normal. She has no history of alcohol excessive use. She is not on any medication suspected to induce chronic pancreatitis. Given her prior history of abdominal surgeries (careeverywhere there is a mention of small bowel surgeries but no details available) her SBO is likely related to adhesions. Labs without electrolyte abnormalities to account for ileus. No anion gap on her BMP. She is afebrile and her leukocytosis was likely due to white blood cell demargination, now resolved to 6.2. She received conservative management for partial SBO and there was no indication for NG tube placement in absence of emesis. Epigastric pain now resolved.   Plan - clear liquid diet, advance at tolerated -Zofran for nausea  and vomiting -Consider chronic pancreatitis work-up as outpatient with PCP   Migraine headaches: She takes Rizatriptan and various OTC NSAIDs. She complains of headaches currently. No neurologic deficits. 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 She takes Keppra 500 mg 3 times a day. Will continue with this. -Pt has follow-up with her neurologist on discharge  Hypothyroidism: Last TSH on 3/15 0.415. Continue newly changed home Synthroid 88 mcg daily.  Pulmonary nodule: Her CT scan revealed an 8 mm noncalcified nodule in the left lower lobe posteriorly. She does have a history of cigarette smoking with 22-pack-years, otherwise no family history of lung cancer. She quit in 2015.  She is at a higher risk of bronchogenic carcinoma and she will need a follow-up chest CT in 3-6 months. PCP to arrange at follow-up.  Vitamin D Deficiency - Recent vitamin-25 OH level of 10.2, continue ergocalciferol 50 K U weekly per PCP  Prediabetes - Last A1c 6 on 3/15. Pt counseled on lifestyle modification.   Code Status: FULL CODE  F/E/N: Advance diet as tolerated   VTE Ppx: subq lovenox  Family Communication: I discussed with the patient about plan of care and all questions answered. Her husband was not present at the time.  Dispo: Disposition is deferred at this time, awaiting improvement of current medical problems.  Anticipated discharge in approximately 1 day(s).   The patient does have a current PCP Lars Mage V, RN) and does need an [facility] hospital follow-up appointment after discharge.  The patient does not have transportation limitations that hinder transportation to clinic appointments.  .Services Needed at time of discharge: Y = Yes, Blank = No PT:  No  OT:  No  RN:  No  Equipment:  None  Other:  None      Carolan Shiver, Med Student 03/08/2015, 3:12 PM

## 2015-03-08 NOTE — Progress Notes (Signed)
D/c to home via wheelchair with husband.  VSS pain denied. Breathing regular and unlabored on room air. D/c instructions given and explianed with understanding by patient

## 2015-03-15 DIAGNOSIS — Z23 Encounter for immunization: Secondary | ICD-10-CM | POA: Insufficient documentation

## 2015-06-12 DIAGNOSIS — G43001 Migraine without aura, not intractable, with status migrainosus: Secondary | ICD-10-CM | POA: Insufficient documentation

## 2016-01-07 DIAGNOSIS — K219 Gastro-esophageal reflux disease without esophagitis: Secondary | ICD-10-CM | POA: Insufficient documentation

## 2016-02-28 DIAGNOSIS — G43009 Migraine without aura, not intractable, without status migrainosus: Secondary | ICD-10-CM | POA: Insufficient documentation

## 2016-08-14 DIAGNOSIS — E785 Hyperlipidemia, unspecified: Secondary | ICD-10-CM | POA: Insufficient documentation

## 2016-08-14 DIAGNOSIS — R7309 Other abnormal glucose: Secondary | ICD-10-CM | POA: Insufficient documentation

## 2016-12-18 DIAGNOSIS — Z1239 Encounter for other screening for malignant neoplasm of breast: Secondary | ICD-10-CM | POA: Insufficient documentation

## 2016-12-18 DIAGNOSIS — F5101 Primary insomnia: Secondary | ICD-10-CM | POA: Insufficient documentation

## 2018-09-09 DIAGNOSIS — M5441 Lumbago with sciatica, right side: Secondary | ICD-10-CM | POA: Insufficient documentation

## 2018-10-21 DIAGNOSIS — J209 Acute bronchitis, unspecified: Secondary | ICD-10-CM | POA: Insufficient documentation

## 2019-07-15 ENCOUNTER — Ambulatory Visit: Payer: Self-pay | Admitting: Family

## 2019-08-01 ENCOUNTER — Other Ambulatory Visit: Payer: Self-pay

## 2019-08-02 ENCOUNTER — Ambulatory Visit: Payer: BC Managed Care – PPO | Admitting: Family

## 2019-08-02 ENCOUNTER — Other Ambulatory Visit: Payer: Self-pay

## 2019-08-02 ENCOUNTER — Encounter: Payer: Self-pay | Admitting: Family

## 2019-08-02 VITALS — BP 144/102 | HR 61 | Temp 98.0°F | Ht 66.0 in | Wt 179.0 lb

## 2019-08-02 DIAGNOSIS — Z1159 Encounter for screening for other viral diseases: Secondary | ICD-10-CM

## 2019-08-02 DIAGNOSIS — R569 Unspecified convulsions: Secondary | ICD-10-CM | POA: Diagnosis not present

## 2019-08-02 DIAGNOSIS — G4700 Insomnia, unspecified: Secondary | ICD-10-CM

## 2019-08-02 DIAGNOSIS — G43001 Migraine without aura, not intractable, with status migrainosus: Secondary | ICD-10-CM

## 2019-08-02 DIAGNOSIS — E039 Hypothyroidism, unspecified: Secondary | ICD-10-CM | POA: Diagnosis not present

## 2019-08-02 DIAGNOSIS — Z23 Encounter for immunization: Secondary | ICD-10-CM

## 2019-08-02 DIAGNOSIS — E559 Vitamin D deficiency, unspecified: Secondary | ICD-10-CM

## 2019-08-02 DIAGNOSIS — Z780 Asymptomatic menopausal state: Secondary | ICD-10-CM

## 2019-08-02 DIAGNOSIS — E785 Hyperlipidemia, unspecified: Secondary | ICD-10-CM | POA: Diagnosis not present

## 2019-08-02 DIAGNOSIS — Z79899 Other long term (current) drug therapy: Secondary | ICD-10-CM

## 2019-08-02 DIAGNOSIS — K219 Gastro-esophageal reflux disease without esophagitis: Secondary | ICD-10-CM

## 2019-08-02 MED ORDER — PRAVASTATIN SODIUM 40 MG PO TABS: 40.0000 mg | ORAL_TABLET | Freq: Every day | ORAL | 1 refills | Status: DC

## 2019-08-02 MED ORDER — ZOLPIDEM TARTRATE 10 MG PO TABS: ORAL_TABLET | ORAL | 1 refills | Status: DC

## 2019-08-02 MED ORDER — LEVOTHYROXINE SODIUM 88 MCG PO TABS: 88.0000 ug | ORAL_TABLET | Freq: Every day | ORAL | 1 refills | Status: DC

## 2019-08-02 MED ORDER — OMEPRAZOLE 40 MG PO CPDR: 40.0000 mg | DELAYED_RELEASE_CAPSULE | Freq: Every day | ORAL | 1 refills | Status: DC

## 2019-08-02 MED ORDER — VITAMIN D (ERGOCALCIFEROL) 1.25 MG (50000 UNIT) PO CAPS: 50000.0000 [IU] | ORAL_CAPSULE | ORAL | 3 refills | Status: DC

## 2019-08-02 NOTE — Patient Instructions (Signed)
Health Maintenance After Age 67 After age 67, you are at a higher risk for certain long-term diseases and infections as well as injuries from falls. Falls are a major cause of broken bones and head injuries in people who are older than age 67. Getting regular preventive care can help to keep you healthy and well. Preventive care includes getting regular testing and making lifestyle changes as recommended by your health care provider. Talk with your health care provider about:  Which screenings and tests you should have. A screening is a test that checks for a disease when you have no symptoms.  A diet and exercise plan that is right for you. What should I know about screenings and tests to prevent falls? Screening and testing are the best ways to find a health problem early. Early diagnosis and treatment give you the best chance of managing medical conditions that are common after age 67. Certain conditions and lifestyle choices may make you more likely to have a fall. Your health care provider may recommend:  Regular vision checks. Poor vision and conditions such as cataracts can make you more likely to have a fall. If you wear glasses, make sure to get your prescription updated if your vision changes.  Medicine review. Work with your health care provider to regularly review all of the medicines you are taking, including over-the-counter medicines. Ask your health care provider about any side effects that may make you more likely to have a fall. Tell your health care provider if any medicines that you take make you feel dizzy or sleepy.  Osteoporosis screening. Osteoporosis is a condition that causes the bones to get weaker. This can make the bones weak and cause them to break more easily.  Blood pressure screening. Blood pressure changes and medicines to control blood pressure can make you feel dizzy.  Strength and balance checks. Your health care provider may recommend certain tests to check your  strength and balance while standing, walking, or changing positions.  Foot health exam. Foot pain and numbness, as well as not wearing proper footwear, can make you more likely to have a fall.  Depression screening. You may be more likely to have a fall if you have a fear of falling, feel emotionally low, or feel unable to do activities that you used to do.  Alcohol use screening. Using too much alcohol can affect your balance and may make you more likely to have a fall. What actions can I take to lower my risk of falls? General instructions  Talk with your health care provider about your risks for falling. Tell your health care provider if: ? You fall. Be sure to tell your health care provider about all falls, even ones that seem minor. ? You feel dizzy, sleepy, or off-balance.  Take over-the-counter and prescription medicines only as told by your health care provider. These include any supplements.  Eat a healthy diet and maintain a healthy weight. A healthy diet includes low-fat dairy products, low-fat (lean) meats, and fiber from whole grains, beans, and lots of fruits and vegetables. Home safety  Remove any tripping hazards, such as rugs, cords, and clutter.  Install safety equipment such as grab bars in bathrooms and safety rails on stairs.  Keep rooms and walkways well-lit. Activity   Follow a regular exercise program to stay fit. This will help you maintain your balance. Ask your health care provider what types of exercise are appropriate for you.  If you need a cane or   walker, use it as recommended by your health care provider.  Wear supportive shoes that have nonskid soles. Lifestyle  Do not drink alcohol if your health care provider tells you not to drink.  If you drink alcohol, limit how much you have: ? 0-1 drink a day for women. ? 0-2 drinks a day for men.  Be aware of how much alcohol is in your drink. In the [place] one drink equals one typical bottle of beer (12  oz), one-half glass of wine (5 oz), or one shot of hard liquor (1 oz).  Do not use any products that contain nicotine or tobacco, such as cigarettes and e-cigarettes. If you need help quitting, ask your health care provider. Summary  Having a healthy lifestyle and getting preventive care can help to protect your health and wellness after age 67.  Screening and testing are the best way to find a health problem early and help you avoid having a fall. Early diagnosis and treatment give you the best chance for managing medical conditions that are more common for people who are older than age 67.  Falls are a major cause of broken bones and head injuries in people who are older than age 67. Take precautions to prevent a fall at home.  Work with your health care provider to learn what changes you can make to improve your health and wellness and to prevent falls. This information is not intended to replace advice given to you by your health care provider. Make sure you discuss any questions you have with your health care provider. Document Released: 10/07/2017 Document Revised: 03/17/2019 Document Reviewed: 10/07/2017 [employer]vier Patient Education  2020 [employer]  

## 2019-08-02 NOTE — Progress Notes (Signed)
Subjective:    Patient ID: [name], female    DOB: Oct 28, 1951, 68 y.o.   MRN: [id number]  Chief Complaint  Patient presents with  . New Patient (Initial Visit)   Pt presents to the office today to establish care. Pt is followed by Neurologists annually for seizures and migraines. She Amgiv. She states this has greatly imporoved her migarines to once a month from daily.  Migraine  This is a chronic problem. The current episode started more than 1 year ago. The problem occurs monthly. The problem has been waxing and waning. The pain is located in the frontal region. The pain quality is similar to prior headaches. The pain is moderate. Associated symptoms include insomnia, nausea and seizures. Pertinent negatives include no coughing, phonophobia or photophobia. Exacerbated by: stress. The treatment provided moderate relief. Her past medical history is significant for obesity.  Thyroid Problem Presents for follow-up visit. Symptoms include dry skin and fatigue. Patient reports no depressed mood, diaphoresis or diarrhea. The symptoms have been stable. Her past medical history is significant for hyperlipidemia.  Gastroesophageal Reflux She complains of belching, heartburn and nausea. She reports no coughing. This is a chronic problem. The current episode started more than 1 year ago. The problem occurs occasionally. The problem has been waxing and waning. Associated symptoms include fatigue. She has tried a PPI for the symptoms. The treatment provided moderate relief.  Seizures  Episode onset: has not had one in over 2 years. Associated symptoms include nausea. Pertinent negatives include no cough and no diarrhea.  Insomnia Primary symptoms: difficulty falling asleep, frequent awakening.  The current episode started more than one year. The onset quality is gradual. The problem occurs intermittently. The problem has been waxing and waning since onset.  Hyperlipidemia This is a chronic problem.  The current episode started more than 1 year ago. Exacerbating diseases include obesity. Current antihyperlipidemic treatment includes statins. The current treatment provides moderate improvement of lipids.      Review of Systems  Constitutional: Positive for fatigue. Negative for diaphoresis.  Eyes: Negative for photophobia.  Respiratory: Negative for cough.   Gastrointestinal: Positive for heartburn and nausea. Negative for diarrhea.  Neurological: Positive for seizures.  Psychiatric/Behavioral: The patient has insomnia.   All other systems reviewed and are negative.   Social History   Socioeconomic History  . Marital status: Single    Spouse name: Not on file  . Number of children: Not on file  . Years of education: Not on file  . Highest education level: Not on file  Occupational History  . Occupation: IT consultant  Social Needs  . Financial resource strain: Not on file  . Food insecurity    Worry: Not on file    Inability: Not on file  . Transportation needs    Medical: Not on file    Non-medical: Not on file  Tobacco Use  . Smoking status: Former Smoker    Packs/day: 0.50    Years: 22.00    Pack years: 11.00    Types: Cigarettes    Quit date: 03/06/2014    Years since quitting: 5.4  . Smokeless tobacco: Never Used  Substance and Sexual Activity  . Alcohol use: No    Alcohol/week: 0.0 standard drinks  . Drug use: No  . Sexual activity: Not on file  Lifestyle  . Physical activity    Days per week: Not on file    Minutes per session: Not on file  . Stress: Not on  file  Relationships  . Social Herbalist on phone: Not on file    Gets together: Not on file    Attends religious service: Not on file    Active member of club or organization: Not on file    Attends meetings of clubs or organizations: Not on file    Relationship status: Not on file  . Intimate partner violence    Fear of current or ex partner: Not on file    Emotionally abused:  Not on file    Physically abused: Not on file    Forced sexual activity: Not on file  Other Topics Concern  . Not on file  Social History Narrative   Married, no children. 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    No alcohol    History reviewed. No pertinent family history.     Objective:   Physical Exam Vitals signs reviewed.  Constitutional:      General: She is not in acute distress.    Appearance: She is well-developed.  HENT:     Head: Normocephalic and atraumatic.     Right Ear: Tympanic membrane normal.     Left Ear: Tympanic membrane normal.  Eyes:     Pupils: Pupils are equal, round, and reactive to light.  Neck:     Musculoskeletal: Normal range of motion and neck supple.     Thyroid: No thyromegaly.  Cardiovascular:     Rate and Rhythm: Normal rate and regular rhythm.     Heart sounds: Normal heart sounds. No murmur.  Pulmonary:     Effort: Pulmonary effort is normal. No respiratory distress.     Breath sounds: Normal breath sounds. No wheezing.  Abdominal:     General: Bowel sounds are normal. There is no distension.     Palpations: Abdomen is soft.     Tenderness: There is no abdominal tenderness.  Musculoskeletal: Normal range of motion.        General: No tenderness.  Skin:    General: Skin is warm and dry.  Neurological:     Mental Status: She is alert and oriented to person, place, and time.     Cranial Nerves: No cranial nerve deficit.     Deep Tendon Reflexes: Reflexes are normal and symmetric.  Psychiatric:        Behavior: Behavior normal.        Thought Content: Thought content normal.        Judgment: Judgment normal.       BP (!) 148/88   Pulse 67   Temp 98 F (36.7 C) (Temporal)   Ht _0  (1.676 m)   Wt 179 lb (81.2 kg)   LMP  (LMP Unknown)   BMI 28.89 kg/m      Assessment & Plan:  [name] comes in today with chief complaint of New Patient (Initial Visit)   Diagnosis and orders  addressed:  1. Hypothyroidism, unspecified type - CMP14+EGFR - CBC with Differential/Platelet - TSH - levothyroxine (SYNTHROID) 88 MCG tablet; Take 1 tablet (88 mcg total) by mouth daily before breakfast.  Dispense: 90 tablet; Refill: 1  2. Hyperlipidemia, unspecified hyperlipidemia type - CMP14+EGFR - CBC with Differential/Platelet - Lipid panel - pravastatin (PRAVACHOL) 40 MG tablet; Take 1 tablet (40 mg total) by mouth daily.  Dispense: 90 tablet; Refill: 1  3. Seizures ([place]) - CMP14+EGFR - CBC with Differential/Platelet - Ambulatory referral to Neurology  4. Migraine without aura and with status migrainosus, not intractable - CMP14+EGFR - CBC with Differential/Platelet - Ambulatory referral to Neurology  5. Gastroesophageal reflux disease without esophagitis - CMP14+EGFR - CBC with Differential/Platelet - omeprazole (PRILOSEC) 40 MG capsule; Take 1 capsule (40 mg total) by mouth daily.  Dispense: 90 capsule; Refill: 1  6. Vitamin D deficiency - CMP14+EGFR - CBC with Differential/Platelet - DG [facility] DEXA - VITAMIN D 25 Hydroxy (Vit-D Deficiency, Fractures) - Vitamin D, Ergocalciferol, (DRISDOL) 1.25 MG (50000 UT) CAPS capsule; Take 1 capsule (50,000 Units total) by mouth every 7 (seven) days. On Thursdays  Dispense: 5 capsule; Refill: 3  7. Post-menopause - CMP14+EGFR - CBC with Differential/Platelet - DG [facility] DEXA  8. Need for hepatitis C screening test - CMP14+EGFR - CBC with Differential/Platelet - Hepatitis C antibody  9. Insomnia, unspecified type Pt reviewed in [place] controlled database- No red flags noted - zolpidem (AMBIEN) 10 MG tablet; TAKE 1 TABLET BY MOUTH EVERY DAY AT BEDTIME AS NEEDED FOR SLEEP  Dispense: 90 tablet; Refill: 1  10. Controlled substance agreement signed   Labs pending Health Maintenance reviewed Diet and exercise encouraged  Follow up plan: 6 months    Evelina Dun, FNP

## 2019-08-03 ENCOUNTER — Other Ambulatory Visit: Payer: Self-pay | Admitting: Family

## 2019-08-03 DIAGNOSIS — E785 Hyperlipidemia, unspecified: Secondary | ICD-10-CM

## 2019-08-03 DIAGNOSIS — E039 Hypothyroidism, unspecified: Secondary | ICD-10-CM

## 2019-08-03 LAB — LIPID PANEL
Chol/HDL Ratio: 4.7 ratio — ABNORMAL HIGH (ref 0.0–4.4)
Cholesterol, Total: 202 mg/dL — ABNORMAL HIGH (ref 100–199)
HDL: 43 mg/dL (ref >39)
LDL Calculated: 142 mg/dL — ABNORMAL HIGH (ref 0–99)
Triglycerides: 86 mg/dL (ref 0–149)
VLDL Cholesterol Cal: 17 mg/dL (ref 5–40)

## 2019-08-03 LAB — CMP14+EGFR
ALT: 20 IU/L (ref 0–32)
AST: 26 IU/L (ref 0–40)
Albumin/Globulin Ratio: 1.6 (ref 1.2–2.2)
Albumin: 4.2 g/dL (ref 3.8–4.8)
Alkaline Phosphatase: 106 IU/L (ref 39–117)
BUN/Creatinine Ratio: 11 — ABNORMAL LOW (ref 12–28)
BUN: 8 mg/dL (ref 8–27)
Bilirubin Total: 0.4 mg/dL (ref 0.0–1.2)
CO2: 25 mmol/L (ref 20–29)
Calcium: 9.4 mg/dL (ref 8.7–10.3)
Chloride: 103 mmol/L (ref 96–106)
Creatinine, Ser: 0.71 mg/dL (ref 0.57–1.00)
GFR calc Af Amer: 102 mL/min/{1.73_m2} (ref >59)
GFR calc non Af Amer: 88 mL/min/{1.73_m2} (ref >59)
Globulin, Total: 2.6 g/dL (ref 1.5–4.5)
Glucose: 117 mg/dL — ABNORMAL HIGH (ref 65–99)
Potassium: 4.2 mmol/L (ref 3.5–5.2)
Sodium: 142 mmol/L (ref 134–144)
Total Protein: 6.8 g/dL (ref 6.0–8.5)

## 2019-08-03 LAB — CBC WITH DIFFERENTIAL/PLATELET
Basophils Absolute: 0.1 10*3/uL (ref 0.0–0.2)
Basos: 1 %
EOS (ABSOLUTE): 0.3 10*3/uL (ref 0.0–0.4)
Eos: 5 %
Hematocrit: 45.5 % (ref 34.0–46.6)
Hemoglobin: 15.6 g/dL (ref 11.1–15.9)
Immature Grans (Abs): 0 10*3/uL (ref 0.0–0.1)
Immature Granulocytes: 1 %
Lymphocytes Absolute: 2.3 10*3/uL (ref 0.7–3.1)
Lymphs: 35 %
MCH: 32.4 pg (ref 26.6–33.0)
MCHC: 34.3 g/dL (ref 31.5–35.7)
MCV: 94 fL (ref 79–97)
Monocytes Absolute: 0.5 10*3/uL (ref 0.1–0.9)
Monocytes: 8 %
Neutrophils Absolute: 3.4 10*3/uL (ref 1.4–7.0)
Neutrophils: 50 %
Platelets: 262 10*3/uL (ref 150–450)
RBC: 4.82 x10E6/uL (ref 3.77–5.28)
RDW: 12 % (ref 11.7–15.4)
WBC: 6.5 10*3/uL (ref 3.4–10.8)

## 2019-08-03 LAB — TSH: TSH: 7.58 u[IU]/mL — ABNORMAL HIGH (ref 0.450–4.500)

## 2019-08-03 LAB — VITAMIN D 25 HYDROXY (VIT D DEFICIENCY, FRACTURES): Vit D, 25-Hydroxy: 38.4 ng/mL (ref 30.0–100.0)

## 2019-08-03 LAB — HEPATITIS C ANTIBODY: Hep C Virus Ab: <0.1 s/co ratio (ref 0.0–0.9)

## 2019-08-03 MED ORDER — LEVOTHYROXINE SODIUM 100 MCG PO TABS: 100.0000 ug | ORAL_TABLET | Freq: Every day | ORAL | 11 refills | Status: DC

## 2019-08-03 MED ORDER — PRAVASTATIN SODIUM 40 MG PO TABS: 40.0000 mg | ORAL_TABLET | Freq: Every day | ORAL | 1 refills | Status: DC

## 2019-08-10 ENCOUNTER — Encounter: Payer: Self-pay | Admitting: Neurology

## 2019-10-03 ENCOUNTER — Ambulatory Visit: Payer: BLUE CROSS/BLUE SHIELD | Admitting: Neurology

## 2019-10-21 ENCOUNTER — Other Ambulatory Visit: Payer: Self-pay | Admitting: Family

## 2019-10-21 DIAGNOSIS — E559 Vitamin D deficiency, unspecified: Secondary | ICD-10-CM

## 2019-12-16 ENCOUNTER — Ambulatory Visit: Payer: Self-pay | Admitting: Neurology

## 2020-01-26 ENCOUNTER — Other Ambulatory Visit: Payer: Self-pay

## 2020-01-26 ENCOUNTER — Telehealth (INDEPENDENT_AMBULATORY_CARE_PROVIDER_SITE_OTHER): Payer: BC Managed Care – PPO | Admitting: Neurology

## 2020-01-26 DIAGNOSIS — G43709 Chronic migraine without aura, not intractable, without status migrainosus: Secondary | ICD-10-CM

## 2020-01-26 DIAGNOSIS — G40909 Epilepsy, unspecified, not intractable, without status epilepticus: Secondary | ICD-10-CM | POA: Diagnosis not present

## 2020-01-26 DIAGNOSIS — IMO0002 Reserved for concepts with insufficient information to code with codable children: Secondary | ICD-10-CM

## 2020-01-26 MED ORDER — ZONISAMIDE 100 MG PO CAPS: ORAL_CAPSULE | ORAL | 4 refills | Status: DC

## 2020-01-26 MED ORDER — RIZATRIPTAN BENZOATE 10 MG PO TBDP: ORAL_TABLET | ORAL | 11 refills | Status: DC

## 2020-01-26 MED ORDER — LEVETIRACETAM 500 MG PO TABS: 500.0000 mg | ORAL_TABLET | Freq: Three times a day (TID) | ORAL | 3 refills | Status: DC

## 2020-01-26 NOTE — Progress Notes (Signed)
Virtual Visit via Video Note The purpose of this virtual visit is to provide medical care while limiting exposure to the novel coronavirus.    Consent was obtained for video visit:  Yes.   Answered questions that patient had about telehealth interaction:  Yes.   I discussed the limitations, risks, security and privacy concerns of performing an evaluation and management service by telemedicine. I also discussed with the patient that there may be a patient responsible charge related to this service. The patient expressed understanding and agreed to proceed.  Pt location: Home Physician Location: office Name of referring provider:  Sharion Balloon, FNP I connected with [name] at patients initiation/request on 01/26/2020 at  9:00 AM EST by video enabled telemedicine application and verified that I am speaking with the correct person using two identifiers. Pt MRN:  [id number] Pt DOB:  [birth year] Video Participants:  [name]   History of Present Illness:  This is a 69 year old right-handed woman with a history of hyperlipidemia, hypothyroidism, migraines, and seizures, presenting to establish care. She was last seen by her prior neurologist Dr. Linus Salmons a year ago, at that point she was doing well with no seizures on Keppra and reduction in migraines to 2-3 a month. She reports today that she is having seizures every 3 weeks and migraines occur on a daily basis.  1. Seizures. She states she started having seizures around 2 years ago, however seizures are noted in Dr. Daune Perch note in 2017 that she had not had any seizures in over a year. She reports seizures start with a funny feeling, she gets very hot and starts sweating. She would lose control of her body, sometimes may black out. She feels like she cannot sit still. She has only fallen to the ground one time. She states "they said they could be like a panic attack." She would usually sit down and the feeling would pass. She denies  any staring/unresponsive episodes, gaps in time, stating she remembers everything. She has had a couple at home and some at work. Her significant other Edd Arbour states she is "out her her head,scared, wanting to get up." She would be making sense. Symptoms last 30-40 minutes, he states she is responsive during them. The last episode occurred at work last month lasting 30 minutes. She would usually have a headache prior to the events, similar to her typical migraines. She is taking Levetiracetam 500mg  TID and feels that since this was started, the episodes are not happening that much but still come and go. She denies any olfactory/gustatory hallucination, myoclonic jerks. She had a normal birth and early development. No history of febrile seizures, CNS infections, significant head injuries, neurosurgical procedures, or family history of seizures.   2. Migraines. She has had migraines "all my life," worse as she got older. She states that hardly a day goes by without a headache, from a 3/10 to 8/10. Today she states that [place] did not help, then states it helped some but she still has them. She is not on Aimovg any longer. Headaches start in her neck then goes up to the top of her head. She describes throbbing like a heartbeat, with daily headaches for the past 2-3 years. Light seems to get dim, there is occasional nausea. Headaches wake her up from sleep, she usually gets 4 hours of sleep then a headache wakes her up. 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 She has occasional hand numbness. No dizziness, diplopia, dysarthria/dysphagia. She has prn Maxalt but cannot take it at work because it makes her sleepy. She recalls taking amitriptyline (dry mouth) and Topiramate (cannot recall side effects) in the past. She takes Zolpidem for sleep but feels that headaches seem worse when she takes it, so she only takes it 1-2 times a month. She has been taking  Tylenol 1-2 times daily for the past 10 years or more. She states she has no headache today. No family history of migraines. She has Robaxin prn 3-4 times a week, no side effects.    Current Outpatient Medications on File Prior to Visit  Medication Sig Dispense Refill  . albuterol (PROAIR HFA) 108 (90 Base) MCG/ACT inhaler PLEASE SEE ATTACHED FOR DETAILED DIRECTIONS    . levETIRAcetam (KEPPRA) 500 MG tablet Take 500 mg by mouth 3 (three) times daily.     Marland Kitchen levothyroxine (SYNTHROID) 100 MCG tablet Take 1 tablet (100 mcg total) by mouth daily. 30 tablet 11  . methocarbamol (ROBAXIN) 500 MG tablet Take 500 mg by mouth 3 (three) times daily.    Marland Kitchen omeprazole (PRILOSEC) 40 MG capsule Take 1 capsule (40 mg total) by mouth daily. 90 capsule 1  . pravastatin (PRAVACHOL) 40 MG tablet Take 1 tablet (40 mg total) by mouth daily. 90 tablet 1  . rizatriptan (MAXALT-MLT) 10 MG disintegrating tablet Take 10 mg by mouth as needed for migraine.   1  . Vitamin D, Ergocalciferol, (DRISDOL) 1.25 MG (50000 UT) CAPS capsule TAKE 1 CAPSULE (50,000 UNITS TOTAL) BY MOUTH EVERY 7 (SEVEN) DAYS. ON THURSDAYS 12 capsule 1  . zolpidem (AMBIEN) 10 MG tablet TAKE 1 TABLET BY MOUTH EVERY DAY AT BEDTIME AS NEEDED FOR SLEEP 90 tablet 1   No current facility-administered medications on file prior to visit.    PAST MEDICAL HISTORY: Past Medical History:  Diagnosis Date  . Diverticulitis   . Headache disorder    being treated as migraine  . Heart burn   . Hypothyroidism   . Seizures (HCC)    i've had "some sort of seizures"    PAST SURGICAL HISTORY: Past Surgical History:  Procedure Laterality Date  . APPENDECTOMY    . COLOSTOMY REVERSAL     possibly around 2007     ALLERGIES: Allergies  Allergen Reactions  . Atorvastatin Other (See Comments)    Made her ache all over  . Pollen Extract     Observations/Objective:   GEN:  The patient appears stated age and is in NAD.  Neurological examination: Patient is  awake, alert, oriented x 3. No aphasia or dysarthria. Intact fluency and comprehension. Remote and recent memory intact. Cranial nerves: Extraocular movements intact with no nystagmus. No facial asymmetry. Motor: moves all extremities symmetrically, at least anti-gravity x 4. No incoordination on finger to nose testing. Gait: narrow-based and steady, able to tandem walk adequately. Negative Romberg test.   Assessment and Plan:   This is a 69 year old right-handed woman with a history of hyperlipidemia, hypothyroidism, migraines, and seizures, presenting to establish care. Etiology of seizures unclear, history suggestive of vasovagal component versus panic attack. Records from her prior neurologist will be requested for review (EEGs, MRI). Continue Levetiracetam 500mg  TID for now. We discussed daily headaches, likely due to medication overuse headaches. Instructed to start weaning off Tylenol. She has prn Maxalt for migraine rescue and knows to minimize rescue medication to 2-3 times a week. She is agreeable  to starting a different migraine preventative medication, Zonisamide, which is also a seizure medication, side effects discussed. Start Zonisamide 100mg  qhs x 2 weeks, then increase to 200mg  qhs x 2 weeks, then to 300mg  qhs.  driving laws were discussed, she knows to stop driving after an episode of loss of consciousness until 6 months event-free. Follow-up in 4 months, she knows to call for any changes.   Follow Up Instructions:   -I discussed the assessment and treatment plan with the patient. The patient was provided an opportunity to ask questions and all were answered. The patient agreed with the plan and demonstrated an understanding of the instructions.   The patient was advised to call back or seek an in-person evaluation if the symptoms worsen or if the condition fails to improve as anticipated.     , MD

## 2020-02-02 ENCOUNTER — Ambulatory Visit: Payer: BC Managed Care – PPO | Admitting: Family

## 2020-02-02 ENCOUNTER — Other Ambulatory Visit: Payer: BC Managed Care – PPO

## 2020-02-10 ENCOUNTER — Encounter: Payer: Self-pay | Admitting: Neurology

## 2020-02-27 ENCOUNTER — Other Ambulatory Visit: Payer: Self-pay | Admitting: Family

## 2020-02-27 ENCOUNTER — Ambulatory Visit (INDEPENDENT_AMBULATORY_CARE_PROVIDER_SITE_OTHER): Payer: Medicare Other | Admitting: Family

## 2020-02-27 ENCOUNTER — Encounter: Payer: Self-pay | Admitting: Family

## 2020-02-27 ENCOUNTER — Ambulatory Visit (INDEPENDENT_AMBULATORY_CARE_PROVIDER_SITE_OTHER): Payer: Medicare Other

## 2020-02-27 ENCOUNTER — Other Ambulatory Visit: Payer: Self-pay

## 2020-02-27 VITALS — BP 127/83 | HR 65 | Temp 97.1°F | Ht 66.0 in | Wt 163.8 lb

## 2020-02-27 DIAGNOSIS — E785 Hyperlipidemia, unspecified: Secondary | ICD-10-CM | POA: Diagnosis not present

## 2020-02-27 DIAGNOSIS — E039 Hypothyroidism, unspecified: Secondary | ICD-10-CM

## 2020-02-27 DIAGNOSIS — G4700 Insomnia, unspecified: Secondary | ICD-10-CM

## 2020-02-27 DIAGNOSIS — Z1211 Encounter for screening for malignant neoplasm of colon: Secondary | ICD-10-CM

## 2020-02-27 DIAGNOSIS — M810 Age-related osteoporosis without current pathological fracture: Secondary | ICD-10-CM | POA: Diagnosis not present

## 2020-02-27 DIAGNOSIS — K219 Gastro-esophageal reflux disease without esophagitis: Secondary | ICD-10-CM

## 2020-02-27 DIAGNOSIS — Z780 Asymptomatic menopausal state: Secondary | ICD-10-CM

## 2020-02-27 DIAGNOSIS — E559 Vitamin D deficiency, unspecified: Secondary | ICD-10-CM

## 2020-02-27 DIAGNOSIS — G43001 Migraine without aura, not intractable, with status migrainosus: Secondary | ICD-10-CM

## 2020-02-27 DIAGNOSIS — G40909 Epilepsy, unspecified, not intractable, without status epilepticus: Secondary | ICD-10-CM

## 2020-02-27 DIAGNOSIS — Z1212 Encounter for screening for malignant neoplasm of rectum: Secondary | ICD-10-CM

## 2020-02-27 DIAGNOSIS — M8589 Other specified disorders of bone density and structure, multiple sites: Secondary | ICD-10-CM | POA: Diagnosis not present

## 2020-02-27 DIAGNOSIS — F5101 Primary insomnia: Secondary | ICD-10-CM

## 2020-02-27 MED ORDER — ALENDRONATE SODIUM 70 MG PO TABS: 70.0000 mg | ORAL_TABLET | ORAL | 11 refills | Status: DC

## 2020-02-27 MED ORDER — ZOLPIDEM TARTRATE 10 MG PO TABS: ORAL_TABLET | ORAL | 3 refills | Status: DC

## 2020-02-27 NOTE — Progress Notes (Signed)
Subjective:    Patient ID: [name], female    DOB: [birth year], 69 y.o.   MRN: [id number]  Chief Complaint  Patient presents with  . Medical Management of Chronic Issues   Pt presents to the office today today for chronic follow up. Pt is followed by Neurologists annually for seizures and migraines. She has recently started Zonefran 100 mg . She continues to have headaches every other day.  Thyroid Problem Presents for follow-up visit. Symptoms include constipation and fatigue. Patient reports no depressed mood or dry skin. The symptoms have been stable. Her past medical history is significant for hyperlipidemia.  Gastroesophageal Reflux She complains of belching and heartburn. This is a chronic problem. The current episode started more than 1 year ago. The problem occurs occasionally. The problem has been waxing and waning. Associated symptoms include fatigue. She has tried a PPI for the symptoms. The treatment provided moderate relief.  Migraine  This is a chronic problem. The current episode started more than 1 year ago. The problem occurs daily. The pain is located in the occipital region. The pain quality is similar to prior headaches. Associated symptoms include insomnia. Pertinent negatives include no phonophobia or photophobia. Her past medical history is significant for obesity.  Insomnia The current episode started more than one year. The onset quality is gradual. The problem occurs intermittently. The problem has been waxing and waning since onset. Past treatments include medication. The treatment provided moderate relief.  Hyperlipidemia This is a chronic problem. The current episode started more than 1 year ago. The problem is uncontrolled. Recent lipid tests were reviewed and are high. Exacerbating diseases include obesity. Current antihyperlipidemic treatment includes statins. The current treatment provides moderate improvement of lipids. Risk factors for coronary artery  disease include post-menopausal and a sedentary lifestyle.      Review of Systems  Constitutional: Positive for fatigue.  Eyes: Negative for photophobia.  Gastrointestinal: Positive for constipation and heartburn.  Psychiatric/Behavioral: The patient has insomnia.   All other systems reviewed and are negative.      Objective:   Physical Exam Vitals reviewed.  Constitutional:      General: She is not in acute distress.    Appearance: She is well-developed.  HENT:     Head: Normocephalic and atraumatic.     Right Ear: Tympanic membrane normal.     Left Ear: Tympanic membrane normal.  Eyes:     Pupils: Pupils are equal, round, and reactive to light.  Neck:     Thyroid: No thyromegaly.  Cardiovascular:     Rate and Rhythm: Normal rate and regular rhythm.     Heart sounds: Normal heart sounds. No murmur.  Pulmonary:     Effort: Pulmonary effort is normal. No respiratory distress.     Breath sounds: Normal breath sounds. No wheezing.  Abdominal:     General: Bowel sounds are normal. There is no distension.     Palpations: Abdomen is soft.     Tenderness: There is no abdominal tenderness.  Musculoskeletal:        General: No tenderness. Normal range of motion.     Cervical back: Normal range of motion and neck supple.  Skin:    General: Skin is warm and dry.  Neurological:     Mental Status: She is alert and oriented to person, place, and time.     Cranial Nerves: No cranial nerve deficit.     Deep Tendon Reflexes: Reflexes are normal and symmetric.  Psychiatric:  Behavior: Behavior normal.        Thought Content: Thought content normal.        Judgment: Judgment normal.        BP 127/83   Pulse 65   Temp (!) 97.1 F (36.2 C) (Temporal)   Ht 5' 6"  (1.676 m)   Wt 163 lb 12.8 oz (74.3 kg)   LMP  (LMP Unknown)   SpO2 98%   BMI 26.44 kg/m   Assessment & Plan:  ADYN HOES comes in today with chief complaint of Medical Management of Chronic  Issues   Diagnosis and orders addressed:  1. 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 Gastroesophageal reflux disease without esophagitis - CMP14+EGFR - CBC with Differential/Platelet  3. Hypothyroidism, unspecified type - CMP14+EGFR - CBC with Differential/Platelet - TSH  4. Seizure disorder (HCC) - CMP14+EGFR - CBC with Differential/Platelet  5. Hyperlipidemia, unspecified hyperlipidemia type - CMP14+EGFR - CBC with Differential/Platelet  6. Primary insomnia - CMP14+EGFR - CBC with Differential/Platelet  7. Vitamin D deficiency - CMP14+EGFR - CBC with Differential/Platelet  8. Colon cancer screening - Cologuard  9. Screening for malignant neoplasm of the rectum - Cologuard  10. Insomnia, unspecified type - zolpidem (AMBIEN) 10 MG tablet; TAKE 1 TABLET BY MOUTH EVERY DAY AT BEDTIME AS NEEDED FOR SLEEP  Dispense: 30 tablet; Refill: 3   Labs pending Health Maintenance reviewed- Mammogram ordered Diet and exercise encouraged  Follow up plan: 6 months and keep neurologists appt   Evelina Dun, FNP

## 2020-02-27 NOTE — Patient Instructions (Signed)
Health Maintenance After Age 68 After age 68, you are at a higher risk for certain long-term diseases and infections as well as injuries from falls. Falls are a major cause of broken bones and head injuries in people who are older than age 68. Getting regular preventive care can help to keep you healthy and well. Preventive care includes getting regular testing and making lifestyle changes as recommended by your health care provider. Talk with your health care provider about:  Which screenings and tests you should have. A screening is a test that checks for a disease when you have no symptoms.  A diet and exercise plan that is right for you. What should I know about screenings and tests to prevent falls? Screening and testing are the best ways to find a health problem early. Early diagnosis and treatment give you the best chance of managing medical conditions that are common after age 68. Certain conditions and lifestyle choices may make you more likely to have a fall. Your health care provider may recommend:  Regular vision checks. Poor vision and conditions such as cataracts can make you more likely to have a fall. If you wear glasses, make sure to get your prescription updated if your vision changes.  Medicine review. Work with your health care provider to regularly review all of the medicines you are taking, including over-the-counter medicines. Ask your health care provider about any side effects that may make you more likely to have a fall. Tell your health care provider if any medicines that you take make you feel dizzy or sleepy.  Osteoporosis screening. Osteoporosis is a condition that causes the bones to get weaker. This can make the bones weak and cause them to break more easily.  Blood pressure screening. Blood pressure changes and medicines to control blood pressure can make you feel dizzy.  Strength and balance checks. Your health care provider may recommend certain tests to check your  strength and balance while standing, walking, or changing positions.  Foot health exam. Foot pain and numbness, as well as not wearing proper footwear, can make you more likely to have a fall.  Depression screening. You may be more likely to have a fall if you have a fear of falling, feel emotionally low, or feel unable to do activities that you used to do.  Alcohol use screening. Using too much alcohol can affect your balance and may make you more likely to have a fall. What actions can I take to lower my risk of falls? General instructions  Talk with your health care provider about your risks for falling. Tell your health care provider if: ? You fall. Be sure to tell your health care provider about all falls, even ones that seem minor. ? You feel dizzy, sleepy, or off-balance.  Take over-the-counter and prescription medicines only as told by your health care provider. These include any supplements.  Eat a healthy diet and maintain a healthy weight. A healthy diet includes low-fat dairy products, low-fat (lean) meats, and fiber from whole grains, beans, and lots of fruits and vegetables. Home safety  Remove any tripping hazards, such as rugs, cords, and clutter.  Install safety equipment such as grab bars in bathrooms and safety rails on stairs.  Keep rooms and walkways well-lit. Activity   Follow a regular exercise program to stay fit. This will help you maintain your balance. Ask your health care provider what types of exercise are appropriate for you.  If you need a cane or   walker, use it as recommended by your health care provider.  Wear supportive shoes that have nonskid soles. Lifestyle  Do not drink alcohol if your health care provider tells you not to drink.  If you drink alcohol, limit how much you have: ? 0-1 drink a day for women. ? 0-2 drinks a day for men.  Be aware of how much alcohol is in your drink. In the [place] one drink equals one typical bottle of beer (12  oz), one-half glass of wine (5 oz), or one shot of hard liquor (1 oz).  Do not use any products that contain nicotine or tobacco, such as cigarettes and e-cigarettes. If you need help quitting, ask your health care provider. Summary  Having a healthy lifestyle and getting preventive care can help to protect your health and wellness after age 68.  Screening and testing are the best way to find a health problem early and help you avoid having a fall. Early diagnosis and treatment give you the best chance for managing medical conditions that are more common for people who are older than age 68.  Falls are a major cause of broken bones and head injuries in people who are older than age 68. Take precautions to prevent a fall at home.  Work with your health care provider to learn what changes you can make to improve your health and wellness and to prevent falls. This information is not intended to replace advice given to you by your health care provider. Make sure you discuss any questions you have with your health care provider. Document Revised: 03/17/2019 Document Reviewed: 10/07/2017 Elsevier Patient Education  2020 Elsevier Inc.  

## 2020-02-28 ENCOUNTER — Telehealth: Payer: Self-pay | Admitting: Family

## 2020-02-28 ENCOUNTER — Other Ambulatory Visit: Payer: Self-pay | Admitting: Family

## 2020-02-28 LAB — CMP14+EGFR
ALT: 13 IU/L (ref 0–32)
AST: 17 IU/L (ref 0–40)
Albumin/Globulin Ratio: 1.8 (ref 1.2–2.2)
Albumin: 4.1 g/dL (ref 3.8–4.8)
Alkaline Phosphatase: 102 IU/L (ref 39–117)
BUN/Creatinine Ratio: 20 (ref 12–28)
BUN: 16 mg/dL (ref 8–27)
Bilirubin Total: 0.3 mg/dL (ref 0.0–1.2)
CO2: 22 mmol/L (ref 20–29)
Calcium: 10 mg/dL (ref 8.7–10.3)
Chloride: 107 mmol/L — ABNORMAL HIGH (ref 96–106)
Creatinine, Ser: 0.79 mg/dL (ref 0.57–1.00)
GFR calc Af Amer: 90 mL/min/{1.73_m2} (ref >59)
GFR calc non Af Amer: 78 mL/min/{1.73_m2} (ref >59)
Globulin, Total: 2.3 g/dL (ref 1.5–4.5)
Glucose: 103 mg/dL — ABNORMAL HIGH (ref 65–99)
Potassium: 4.9 mmol/L (ref 3.5–5.2)
Sodium: 143 mmol/L (ref 134–144)
Total Protein: 6.4 g/dL (ref 6.0–8.5)

## 2020-02-28 LAB — CBC WITH DIFFERENTIAL/PLATELET
Basophils Absolute: 0.1 10*3/uL (ref 0.0–0.2)
Basos: 1 %
EOS (ABSOLUTE): 0.4 10*3/uL (ref 0.0–0.4)
Eos: 5 %
Hematocrit: 46.6 % (ref 34.0–46.6)
Hemoglobin: 15.9 g/dL (ref 11.1–15.9)
Immature Grans (Abs): 0 10*3/uL (ref 0.0–0.1)
Immature Granulocytes: 0 %
Lymphocytes Absolute: 2.8 10*3/uL (ref 0.7–3.1)
Lymphs: 38 %
MCH: 31.8 pg (ref 26.6–33.0)
MCHC: 34.1 g/dL (ref 31.5–35.7)
MCV: 93 fL (ref 79–97)
Monocytes Absolute: 0.4 10*3/uL (ref 0.1–0.9)
Monocytes: 6 %
Neutrophils Absolute: 3.8 10*3/uL (ref 1.4–7.0)
Neutrophils: 50 %
Platelets: 278 10*3/uL (ref 150–450)
RBC: 5 x10E6/uL (ref 3.77–5.28)
RDW: 12.6 % (ref 11.7–15.4)
WBC: 7.5 10*3/uL (ref 3.4–10.8)

## 2020-02-28 LAB — TSH: TSH: 5.71 u[IU]/mL — ABNORMAL HIGH (ref 0.450–4.500)

## 2020-02-28 MED ORDER — LEVOTHYROXINE SODIUM 112 MCG PO TABS: 112.0000 ug | ORAL_TABLET | Freq: Every day | ORAL | 4 refills | Status: AC

## 2020-02-28 NOTE — Telephone Encounter (Signed)
Patient aware of results and verbalizes understanding.  

## 2020-03-08 ENCOUNTER — Telehealth: Payer: Self-pay

## 2020-03-08 NOTE — Telephone Encounter (Signed)
Spoke with pt she had called to office asking if she should be taken her Levetiacetam 500mg  TID and Zonisamide300mg  qhs. Pt advised per Dr.Aquinos notes yes she was to take both medication. Pt verbalized understanding ,

## 2020-04-19 ENCOUNTER — Other Ambulatory Visit: Payer: Self-pay | Admitting: Neurology

## 2020-04-26 ENCOUNTER — Other Ambulatory Visit: Payer: Self-pay | Admitting: Family

## 2020-04-26 DIAGNOSIS — E785 Hyperlipidemia, unspecified: Secondary | ICD-10-CM

## 2020-05-02 DIAGNOSIS — Z1231 Encounter for screening mammogram for malignant neoplasm of breast: Secondary | ICD-10-CM | POA: Diagnosis not present

## 2020-05-30 ENCOUNTER — Encounter: Payer: Self-pay | Admitting: Neurology

## 2020-05-30 ENCOUNTER — Ambulatory Visit: Payer: Medicare Other | Admitting: Neurology

## 2020-05-30 ENCOUNTER — Other Ambulatory Visit: Payer: Self-pay

## 2020-05-30 ENCOUNTER — Other Ambulatory Visit: Payer: Self-pay | Admitting: Family

## 2020-05-30 VITALS — BP 142/90 | HR 62 | Ht 66.0 in | Wt 178.4 lb

## 2020-05-30 DIAGNOSIS — G43709 Chronic migraine without aura, not intractable, without status migrainosus: Secondary | ICD-10-CM

## 2020-05-30 DIAGNOSIS — G40909 Epilepsy, unspecified, not intractable, without status epilepticus: Secondary | ICD-10-CM

## 2020-05-30 DIAGNOSIS — IMO0002 Reserved for concepts with insufficient information to code with codable children: Secondary | ICD-10-CM

## 2020-05-30 MED ORDER — TIZANIDINE HCL 4 MG PO TABS: ORAL_TABLET | ORAL | 11 refills | Status: AC

## 2020-05-30 MED ORDER — RIZATRIPTAN BENZOATE 10 MG PO TBDP: ORAL_TABLET | ORAL | 11 refills | Status: AC

## 2020-05-30 MED ORDER — LEVETIRACETAM 500 MG PO TABS: 500.0000 mg | ORAL_TABLET | Freq: Three times a day (TID) | ORAL | 3 refills | Status: AC

## 2020-05-30 MED ORDER — EMGALITY 120 MG/ML ~~LOC~~ SOSY: 1.0000 "pen " | PREFILLED_SYRINGE | SUBCUTANEOUS | 11 refills | Status: AC

## 2020-05-30 NOTE — Patient Instructions (Signed)
Good to see you again (in person!).  1. Start the Emgality injections once a month to help cut down on the migraines  2. Try the Tizanidine muscle relaxant at night as needed for neck pain. If no significant drowsiness, may take during the day as needed up to three times a day  3. Take the Maxalt as needed for migraine rescue. Great job with minimizing Tylenol intake to 2-3 times a week  4. Continue Keppra 500mg  three times a day  5. Follow-up in 6 months, call for any changes  Seizure Precautions: 1. If medication has been prescribed for you to prevent seizures, take it exactly as directed.  Do not stop taking the medicine without talking to your doctor first, even if you have not had a seizure in a long time.   2. Avoid activities in which a seizure would cause danger to yourself or to others.  Don't operate dangerous machinery, swim alone, or climb in high or dangerous places, such as on ladders, roofs, or girders.  Do not drive unless your doctor says you may.  3. If you have any warning that you may have a seizure, lay down in a safe place where you can't hurt yourself.    4.  No driving for 6 months from last seizure, as per [facility].   Please refer to the following link on the Epilepsy Foundation of America's website for more information: http://www.epilepsyfoundation.org/answerplace/Social/driving/drivingu.cfm   5.  Maintain good sleep hygiene. Avoid alcohol.  6.  Contact your doctor if you have any problems that may be related to the medicine you are taking.  7.  Call 911 and bring the patient back to the ED if:        A.  The seizure lasts longer than 5 minutes.       B.  The patient doesn't awaken shortly after the seizure  C.  The patient has new problems such as difficulty seeing, speaking or moving  D.  The patient was injured during the seizure  E.  The patient has a temperature over 102 F (39C)  F.  The patient vomited and now is having trouble  breathing

## 2020-05-30 NOTE — Progress Notes (Signed)
NEUROLOGY FOLLOW UP OFFICE NOTE  [name] [id number] [birth year]  HISTORY OF PRESENT ILLNESS: I had the pleasure of seeing [name] in follow-up in the neurology clinic on 05/30/2020.  The patient was last seen 4 months ago for seizures and migraines. She is alone in the office today. On her last visit, she was reporting seizures every 3 weeks and daily headaches. She was started on Zonisamide in addition to Levetiracetam 500mg  TID. She states that she did fine initially with Zonisamide, then once she started taking 300mg  qhs, headaches were worse and she started having palpitations. She stopped it 1 month ago. She denies any seizures in the past 4 months. She continues to report daily headaches. She has cut down on Tylenol intake to 2-3 times a week. She takes prn Maxalt for the really bad ones, which helps. She has noticed that headaches wake her up at night, starting with neck pain radiating up the back of her head. She reports neck pain, more on the right. She has occasional sleep difficulties, she has Ambien but hardly takes it because she "sleeps too hard" and wakes up with worsened headaches. She has noticed easy bruising. No falls. She reports Aimovig helped some in the past but she had to stop it due to cost.   History on Initial Assessment 01/26/2020: This is a 69 year old right-handed woman with a history of hyperlipidemia, hypothyroidism, migraines, and seizures, presenting to establish care. She was last seen by her prior neurologist Dr. 01/28/2020 a year ago, at that point she was doing well with no seizures on Keppra and reduction in migraines to 2-3 a month. She reports today that she is having seizures every 3 weeks and migraines occur on a daily basis.  1. Seizures. She states she started having seizures around 2 years ago, however seizures are noted in Dr. 79 note in 2017 that she had not had any seizures in over a year. She reports seizures start with a funny feeling, she  gets very hot and starts sweating. She would lose control of her body, sometimes may black out. She feels like she cannot sit still. She has only fallen to the ground one time. She states "they said they could be like a panic attack." She would usually sit down and the feeling would pass. She denies any staring/unresponsive episodes, gaps in time, stating she remembers everything. She has had a couple at home and some at work. Her significant other Richardo Hanks states she is "out her her head,scared, wanting to get up." She would be making sense. Symptoms last 30-40 minutes, he states she is responsive during them. The last episode occurred at work last month lasting 30 minutes. She would usually have a headache prior to the events, similar to her typical migraines. She is taking Levetiracetam 500mg  TID and feels that since this was started, the episodes are not happening that much but still come and go. She denies any olfactory/gustatory hallucination, myoclonic jerks. She had a normal birth and early development. No history of febrile seizures, CNS infections, significant head injuries, neurosurgical procedures, or family history of seizures.   2. Migraines. She has had migraines "all my life," worse as she got older. She states that hardly a day goes by without a headache, from a 3/10 to 8/10. Today she states that Aimovig did not help, then states it helped some but she still has them. She is not on Aimovig any longer. Headaches start in her neck then goes  up to the top of her head. She describes throbbing like a heartbeat, with daily headaches for the past 2-3 years. Light seems to get dim, there is occasional nausea. Headaches wake her up from sleep, she usually gets 4 hours of sleep then a headache wakes her up. 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 She has occasional hand numbness. No dizziness, diplopia, dysarthria/dysphagia. She has prn Maxalt  but cannot take it at work because it makes her sleepy. She recalls taking amitriptyline (dry mouth) and Topiramate (cannot recall side effects) in the past. She takes Zolpidem for sleep but feels that headaches seem worse when she takes it, so she only takes it 1-2 times a month. She has been taking Tylenol 1-2 times daily for the past 10 years or more. She states she has no headache today. No family history of migraines. She has Robaxin prn 3-4 times a week, no side effects.   Prior AEDs: Zonisamide, Topiramate Prior migraine preventative medications: amitriptyline, Topiramate, Zonisamide, Aimovig   PAST MEDICAL HISTORY: Past Medical History:  Diagnosis Date  . Diverticulitis   . Headache disorder    being treated as migraine  . Heart burn   . Hypothyroidism   . Seizures ([place])    i've had "some sort of seizures"    MEDICATIONS: Current Outpatient Medications on File Prior to Visit  Medication Sig Dispense Refill  . levETIRAcetam (KEPPRA) 500 MG tablet Take 1 tablet (500 mg total) by mouth 3 (three) times daily. 270 tablet 3  . levothyroxine (SYNTHROID) 112 MCG tablet Take 1 tablet (112 mcg total) by mouth daily. 90 tablet 4  . methocarbamol (ROBAXIN) 500 MG tablet Take 500 mg by mouth 3 (three) times daily.    Marland Kitchen omeprazole (PRILOSEC) 40 MG capsule Take 1 capsule (40 mg total) by mouth daily. 90 capsule 1  . pravastatin (PRAVACHOL) 40 MG tablet TAKE 1 TABLET BY MOUTH EVERY DAY 90 tablet 0  . rizatriptan (MAXALT-MLT) 10 MG disintegrating tablet Take 1 tablet as needed for headache. Do not take more than 2-3 times a week 10 tablet 11  . Vitamin D, Ergocalciferol, (DRISDOL) 1.25 MG (50000 UT) CAPS capsule TAKE 1 CAPSULE (50,000 UNITS TOTAL) BY MOUTH EVERY 7 (SEVEN) DAYS. ON THURSDAYS 12 capsule 1  . zolpidem (AMBIEN) 10 MG tablet TAKE 1 TABLET BY MOUTH EVERY DAY AT BEDTIME AS NEEDED FOR SLEEP 30 tablet 3   No current facility-administered medications on file prior to visit.     ALLERGIES: Allergies  Allergen Reactions  . Atorvastatin Other (See Comments)    Made her ache all over  . Pollen Extract     FAMILY HISTORY: History reviewed. No pertinent family history.  SOCIAL HISTORY: Social History   Socioeconomic History  . Marital status: Single    Spouse name: Not on file  . Number of children: Not on file  . Years of education: Not on file  . Highest education level: Not on file  Occupational History  . Occupation: cotton spinning  Tobacco Use  . Smoking status: Former Smoker    Packs/day: 0.50    Years: 22.00    Pack years: 11.00    Types: Cigarettes    Quit date: 03/06/2014    Years since quitting: 6.2  . Smokeless tobacco: Never Used  Vaping Use  . Vaping Use: Never used  Substance and Sexual Activity  . Alcohol use: No  Alcohol/week: 0.0 standard drinks  . Drug use: No  . Sexual activity: Not on file  Other Topics Concern  . Not on file  Social History Narrative   Married, no children. 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    No alcohol    Right handed   Social Determinants of Health   Financial Resource Strain:   . Difficulty of Paying Living Expenses:   Food Insecurity:   . Worried About Programme researcher, broadcasting/film/video in the Last Year:   . Barista in the Last Year:   Transportation Needs:   . Freight forwarder (Medical):   Marland Kitchen Lack of Transportation (Non-Medical):   Physical Activity:   . Days of Exercise per Week:   . Minutes of Exercise per Session:   Stress:   . Feeling of Stress :   Social Connections:   . Frequency of Communication with Friends and Family:   . Frequency of Social Gatherings with Friends and Family:   . Attends Religious Services:   . Active Member of Clubs or Organizations:   . Attends Banker Meetings:   Marland Kitchen Marital Status:   Intimate Partner Violence:   . Fear of Current or Ex-Partner:   . Emotionally Abused:   Marland Kitchen Physically Abused:   .  Sexually Abused:     PHYSICAL EXAM: Vitals:   05/30/20 1501  BP: (!) 142/90  Pulse: 62  SpO2: 97%   General: No acute distress Head:  Normocephalic/atraumatic Skin/Extremities: No rash, no edema Neurological Exam: alert and oriented to person, place, and time. No aphasia or dysarthria. Fund of knowledge is appropriate.  Recent and remote memory are intact.  Attention and concentration are normal. Cranial nerves: Pupils equal, round, reactive to light.Extraocular movements intact with no nystagmus. Visual fields full. Facial sensation intact. No facial asymmetry. Tongue, uvula, palate midline.  Motor: Bulk and tone normal, muscle strength 5/5 throughout with no pronator drift.  Sensation to light touch, temperature intact.  No extinction to double simultaneous stimulation.  Deep tendon reflexes +2 throughout, toes downgoing.  Finger to nose testing intact.  Gait narrow-based and steady, able to tandem walk adequately.    IMPRESSION: This is a 69 yo RH woman with a history of hyperlipidemia, hypothyroidism, presenting for follow-up for migraines and seizures. She denies any seizures in the past 4 months. She had side effects on Zonisamide and stopped it. She continues on Levetiracetam 500mg  TID without side effects. We discussed starting a different migraine preventative medication, Emgality, side effects discussed. She will try a different muscle relaxant for rescue, Tizanidine 4mg  prn, side effects discussed. She has prn Maxalt for migraine rescue and knows to minimize Tylenol intake to 2-3 times a week to avoid rebound headaches. She is aware of [place] driving laws to stop driving after a seizure until 6 months seizure-free. Follow-up in 6 months, she knows to call for any changes.   Thank you for allowing me to participate in her care.  Please do not hesitate to call for any questions or concerns.   , M.D.   CC: , FNP

## 2020-05-31 ENCOUNTER — Encounter: Payer: Self-pay | Admitting: Neurology

## 2020-05-31 ENCOUNTER — Telehealth: Payer: Self-pay | Admitting: Family

## 2020-05-31 NOTE — Progress Notes (Addendum)
Jules Husbands Key: [id number] - PA Case ID: [id number] - Rx #: [id number] Need help? Call us at [phone] Outcome Approvedtoday Request Reference Number: [id number]. EMGALITY INJ 120MG /ML is approved through 12/07/2020. Your patient may now fill this prescription and it will be covered. Drug Emgality 120MG /ML syringes (migraine) Form [employer] Medicare Part D Electronic Prior Authorization Form (2017 NCPDP) Original Claim Info [phone]

## 2020-05-31 NOTE — Telephone Encounter (Signed)
Pt aware of which dose

## 2020-08-28 ENCOUNTER — Ambulatory Visit (INDEPENDENT_AMBULATORY_CARE_PROVIDER_SITE_OTHER): Payer: Medicare Other | Admitting: Family

## 2020-08-28 ENCOUNTER — Other Ambulatory Visit: Payer: Self-pay

## 2020-08-28 ENCOUNTER — Encounter: Payer: Self-pay | Admitting: Family

## 2020-08-28 VITALS — BP 129/74 | HR 63 | Temp 97.5°F | Ht 66.0 in | Wt 185.2 lb

## 2020-08-28 DIAGNOSIS — K219 Gastro-esophageal reflux disease without esophagitis: Secondary | ICD-10-CM

## 2020-08-28 DIAGNOSIS — E559 Vitamin D deficiency, unspecified: Secondary | ICD-10-CM

## 2020-08-28 DIAGNOSIS — E039 Hypothyroidism, unspecified: Secondary | ICD-10-CM

## 2020-08-28 DIAGNOSIS — F5101 Primary insomnia: Secondary | ICD-10-CM | POA: Diagnosis not present

## 2020-08-28 DIAGNOSIS — G40909 Epilepsy, unspecified, not intractable, without status epilepticus: Secondary | ICD-10-CM

## 2020-08-28 DIAGNOSIS — Z23 Encounter for immunization: Secondary | ICD-10-CM

## 2020-08-28 DIAGNOSIS — Z79899 Other long term (current) drug therapy: Secondary | ICD-10-CM

## 2020-08-28 DIAGNOSIS — E785 Hyperlipidemia, unspecified: Secondary | ICD-10-CM

## 2020-08-28 DIAGNOSIS — G43001 Migraine without aura, not intractable, with status migrainosus: Secondary | ICD-10-CM

## 2020-08-28 DIAGNOSIS — G4700 Insomnia, unspecified: Secondary | ICD-10-CM

## 2020-08-28 MED ORDER — ZOLPIDEM TARTRATE 10 MG PO TABS: ORAL_TABLET | ORAL | 3 refills | Status: AC

## 2020-08-28 MED ORDER — CLOBETASOL PROPIONATE 0.05 % EX CREA: 1.0000 "application " | TOPICAL_CREAM | Freq: Two times a day (BID) | CUTANEOUS | 0 refills | Status: DC

## 2020-08-28 NOTE — Progress Notes (Signed)
Subjective:    Patient ID: [name], female    DOB: Jan 12, 1951, 69 y.o.   MRN: [id number]  Chief Complaint  Patient presents with  . Medical Management of Chronic Issues    6 mth, fasting  . Hypothyroidism   Pt presents to the office today today for chronic follow up. Pt is followed by Neurologists annually for seizures and migraines. She is currently taking Emgality . She continues to have headaches every other day. Migraine  This is a chronic problem. The current episode started more than 1 year ago. The problem occurs intermittently. The pain is located in the occipital region. The pain quality is similar to prior headaches. The pain is moderate. Associated symptoms include insomnia, nausea and seizures. Pertinent negatives include no phonophobia or photophobia. The treatment provided moderate relief.  Gastroesophageal Reflux She complains of belching, heartburn and nausea. The current episode started more than 1 year ago. The problem occurs occasionally. The problem has been waxing and waning. Pertinent negatives include no fatigue. She has tried a PPI for the symptoms. The treatment provided moderate relief.  Thyroid Problem Presents for follow-up visit. Patient reports no anxiety, diaphoresis, fatigue or palpitations. Her past medical history is significant for hyperlipidemia.  Seizures  Associated symptoms include nausea.  Hyperlipidemia This is a chronic problem. The current episode started more than 1 year ago. The problem is uncontrolled. Recent lipid tests were reviewed and are high. Current antihyperlipidemic treatment includes statins. The current treatment provides moderate improvement of lipids. Risk factors for coronary artery disease include hypertension and dyslipidemia.  Insomnia Primary symptoms: difficulty falling asleep, frequent awakening.  The current episode started more than one year. The onset quality is gradual. The problem occurs intermittently.   Osteoporosis Takes Fosamax weekly and Vit D and calcium daily.  Last Dexascan 02/27/20.    Review of Systems  Constitutional: Negative for diaphoresis and fatigue.  Eyes: Negative for photophobia.  Cardiovascular: Negative for palpitations.  Gastrointestinal: Positive for heartburn and nausea.  Neurological: Positive for seizures.  Psychiatric/Behavioral: The patient has insomnia. The patient is not nervous/anxious.   All other systems reviewed and are negative.      Objective:   Physical Exam Vitals reviewed.  Constitutional:      General: She is not in acute distress.    Appearance: She is well-developed.  HENT:     Head: Normocephalic and atraumatic.     Right Ear: Tympanic membrane normal.     Left Ear: Tympanic membrane normal.  Eyes:     Pupils: Pupils are equal, round, and reactive to light.  Neck:     Thyroid: No thyromegaly.  Cardiovascular:     Rate and Rhythm: Normal rate and regular rhythm.     Heart sounds: Normal heart sounds. No murmur heard.   Pulmonary:     Effort: Pulmonary effort is normal. No respiratory distress.     Breath sounds: Normal breath sounds. No wheezing.  Abdominal:     General: Bowel sounds are normal. There is no distension.     Palpations: Abdomen is soft.     Tenderness: There is no abdominal tenderness.  Musculoskeletal:        General: No tenderness. Normal range of motion.     Cervical back: Normal range of motion and neck supple.  Skin:    General: Skin is warm and dry.  Neurological:     Mental Status: She is alert and oriented to person, place, and time.  Cranial Nerves: No cranial nerve deficit.     Deep Tendon Reflexes: Reflexes are normal and symmetric.  Psychiatric:        Behavior: Behavior normal.        Thought Content: Thought content normal.        Judgment: Judgment normal.       BP 129/74   Pulse 63   Temp (!) 97.5 F (36.4 C) (Temporal)   Ht _0  (1.676 m)   Wt 185 lb 3.2 oz (84 kg)   LMP   (LMP Unknown)   SpO2 94%   BMI 29.89 kg/m      Assessment & Plan:  [name] comes in today with chief complaint of Medical Management of Chronic Issues (6 mth, fasting) and Hypothyroidism   Diagnosis and orders addressed:  1. 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 Gastroesophageal reflux disease without esophagitis - CMP14+EGFR - CBC with Differential/Platelet  3. Hypothyroidism, unspecified type - CMP14+EGFR - CBC with Differential/Platelet - TSH  4. Primary insomnia - CMP14+EGFR - CBC with Differential/Platelet  5. Vitamin D deficiency - CMP14+EGFR - CBC with Differential/Platelet  6. Seizure disorder ([place]) - CMP14+EGFR - CBC with Differential/Platelet  7. Controlled substance agreement signed - CMP14+EGFR - CBC with Differential/Platelet  8. Hyperlipidemia, unspecified hyperlipidemia type - CMP14+EGFR - CBC with Differential/Platelet - Lipid panel   Labs pending Health Maintenance reviewed Diet and exercise encouraged  Follow up plan: 6 months    Evelina Dun, FNP

## 2020-08-28 NOTE — Addendum Note (Signed)
Addended by: Jannifer Rodney A on: 08/28/2020 11:23 AM   Modules accepted: Orders

## 2020-08-28 NOTE — Patient Instructions (Signed)
Colorectal Cancer Screening  Colorectal cancer screening is a group of tests that are used to check for colorectal cancer before symptoms develop. Colorectal refers to the colon and rectum. The colon and rectum are located at the end of the digestive tract and carry bowel movements out of the body. Who should have screening? All adults starting at age 68 until age 75 should have screening. Your health care provider may recommend screening at age 45. You will have tests every 1-10 years, depending on your results and the type of screening test. You may have screening tests starting at an earlier age, or more frequently than other people, if you have any of the following risk factors:  A personal or family history of colorectal cancer or abnormal growths (polyps).  Inflammatory bowel disease, such as ulcerative colitis or Crohn's disease.  A history of having radiation treatment to the abdomen or pelvic area for cancer.  Colorectal cancer symptoms, such as changes in bowel habits or blood in your stool.  A type of colon cancer syndrome that is passed from parent to child (hereditary), such as: ? Lynch syndrome. ? Familial adenomatous polyposis. ? Turcot syndrome. ? Peutz-Jeghers syndrome. Screening recommendations for adults who are 75-85 years old vary depending on health. How is screening done? There are several types of colorectal screening tests. You may have one or more of the following:  Guaiac-based fecal occult blood testing. For this test, a stool (feces) sample is checked for hidden (occult) blood, which could be a sign of colorectal cancer.  Fecal immunochemical test (FIT). For this test, a stool sample is checked for blood, which could be a sign of colorectal cancer.  Stool DNA test. For this test, a stool sample is checked for blood and changes in DNA that could lead to colorectal cancer.  Sigmoidoscopy. During this test, a thin, flexible tube with a camera on the end  (sigmoidoscope) is used to examine the rectum and the lower colon.  Colonoscopy. During this test, a long, flexible tube with a camera on the end (colonoscope) is used to examine the entire colon and rectum. With a colonoscopy, it is possible to take a sample of tissue (biopsy) and remove small polyps during the test.  Virtual colonoscopy. Instead of a colonoscope, this type of colonoscopy uses X-rays (CT scan) and computers to produce images of the colon and rectum. What are the benefits of screening? Screening reduces your risk for colorectal cancer and can help identify cancer at an early stage, when the cancer can be removed or treated more easily. It is common for polyps to form in the lining of the colon, especially as you age. These polyps may be cancerous or become cancerous over time. Screening can identify these polyps. What are the risks of screening? Each screening test may have different risks.  Stool sample tests have fewer risks than other types of screening tests. However, you may need more tests to confirm results from a stool sample test.  Screening tests that involve X-rays expose you to low levels of radiation, which may slightly increase your cancer risk. The benefit of detecting cancer outweighs the slight increase in risk.  Screening tests such as sigmoidoscopy and colonoscopy may place you at risk for bleeding, intestinal damage, infection, or a reaction to medicines given during the exam. Talk with your health care provider to understand your risk for colorectal cancer and to make a screening plan that is right for you. Questions to ask your health care   provider  When should I start colorectal cancer screening?  What is my risk for colorectal cancer?  How often do I need screening?  Which screening tests do I need?  How do I get my test results?  What do my results mean? Where to find more information Learn more about colorectal cancer screening from:  The  American Cancer Society: www.cancer.org  The National Cancer Institute: www.cancer.gov Summary  Colorectal cancer screening is a group of tests used to check for colorectal cancer before symptoms develop.  Screening reduces your risk for colorectal cancer and can help identify cancer at an early stage, when the cancer can be removed or treated more easily.  All adults starting at age 68 until age 75 should have screening. Your health care provider may recommend screening at age 45.  You may have screening tests starting at an earlier age, or more frequently than other people, if you have certain risk factors.  Talk with your health care provider to understand your risk for colorectal cancer and to make a screening plan that is right for you. This information is not intended to replace advice given to you by your health care provider. Make sure you discuss any questions you have with your health care provider. Document Revised: 03/16/2019 Document Reviewed: 08/26/2017 Elsevier Patient Education  2020 [employer] Inc.  

## 2020-08-29 ENCOUNTER — Other Ambulatory Visit: Payer: Self-pay | Admitting: Family

## 2020-08-29 DIAGNOSIS — E785 Hyperlipidemia, unspecified: Secondary | ICD-10-CM

## 2020-08-29 LAB — CBC WITH DIFFERENTIAL/PLATELET
Basophils Absolute: 0.1 10*3/uL (ref 0.0–0.2)
Basos: 1 %
EOS (ABSOLUTE): 0.2 10*3/uL (ref 0.0–0.4)
Eos: 3 %
Hematocrit: 45.1 % (ref 34.0–46.6)
Hemoglobin: 15.8 g/dL (ref 11.1–15.9)
Immature Grans (Abs): 0 10*3/uL (ref 0.0–0.1)
Immature Granulocytes: 1 %
Lymphocytes Absolute: 3 10*3/uL (ref 0.7–3.1)
Lymphs: 41 %
MCH: 32.2 pg (ref 26.6–33.0)
MCHC: 35 g/dL (ref 31.5–35.7)
MCV: 92 fL (ref 79–97)
Monocytes Absolute: 0.5 10*3/uL (ref 0.1–0.9)
Monocytes: 8 %
Neutrophils Absolute: 3.4 10*3/uL (ref 1.4–7.0)
Neutrophils: 46 %
Platelets: 264 10*3/uL (ref 150–450)
RBC: 4.91 x10E6/uL (ref 3.77–5.28)
RDW: 11.9 % (ref 11.7–15.4)
WBC: 7.2 10*3/uL (ref 3.4–10.8)

## 2020-08-29 LAB — CMP14+EGFR
ALT: 21 IU/L (ref 0–32)
AST: 23 IU/L (ref 0–40)
Albumin/Globulin Ratio: 1.5 (ref 1.2–2.2)
Albumin: 4.1 g/dL (ref 3.8–4.8)
Alkaline Phosphatase: 104 IU/L (ref 44–121)
BUN/Creatinine Ratio: 12 (ref 12–28)
BUN: 9 mg/dL (ref 8–27)
Bilirubin Total: 0.6 mg/dL (ref 0.0–1.2)
CO2: 25 mmol/L (ref 20–29)
Calcium: 9.9 mg/dL (ref 8.7–10.3)
Chloride: 100 mmol/L (ref 96–106)
Creatinine, Ser: 0.77 mg/dL (ref 0.57–1.00)
GFR calc Af Amer: 92 mL/min/{1.73_m2} (ref >59)
GFR calc non Af Amer: 80 mL/min/{1.73_m2} (ref >59)
Globulin, Total: 2.7 g/dL (ref 1.5–4.5)
Glucose: 105 mg/dL — ABNORMAL HIGH (ref 65–99)
Potassium: 5.1 mmol/L (ref 3.5–5.2)
Sodium: 142 mmol/L (ref 134–144)
Total Protein: 6.8 g/dL (ref 6.0–8.5)

## 2020-08-29 LAB — LIPID PANEL
Chol/HDL Ratio: 4.9 ratio — ABNORMAL HIGH (ref 0.0–4.4)
Cholesterol, Total: 190 mg/dL (ref 100–199)
HDL: 39 mg/dL — ABNORMAL LOW (ref >39)
LDL Chol Calc (NIH): 136 mg/dL — ABNORMAL HIGH (ref 0–99)
Triglycerides: 84 mg/dL (ref 0–149)
VLDL Cholesterol Cal: 15 mg/dL (ref 5–40)

## 2020-08-29 LAB — TSH: TSH: 3.02 u[IU]/mL (ref 0.450–4.500)

## 2020-08-29 MED ORDER — PRAVASTATIN SODIUM 40 MG PO TABS: 40.0000 mg | ORAL_TABLET | Freq: Every day | ORAL | 2 refills | Status: AC

## 2020-10-18 ENCOUNTER — Other Ambulatory Visit: Payer: Self-pay | Admitting: Family

## 2020-10-25 ENCOUNTER — Other Ambulatory Visit: Payer: Self-pay | Admitting: Family

## 2020-10-25 DIAGNOSIS — K219 Gastro-esophageal reflux disease without esophagitis: Secondary | ICD-10-CM

## 2020-11-20 ENCOUNTER — Other Ambulatory Visit: Payer: Self-pay | Admitting: Family

## 2020-11-20 DIAGNOSIS — E559 Vitamin D deficiency, unspecified: Secondary | ICD-10-CM

## 2020-12-21 DIAGNOSIS — H2513 Age-related nuclear cataract, bilateral: Secondary | ICD-10-CM | POA: Diagnosis not present

## 2020-12-21 DIAGNOSIS — H524 Presbyopia: Secondary | ICD-10-CM | POA: Diagnosis not present

## 2020-12-21 DIAGNOSIS — H1045 Other chronic allergic conjunctivitis: Secondary | ICD-10-CM | POA: Diagnosis not present

## 2020-12-31 ENCOUNTER — Ambulatory Visit: Payer: Medicare Other | Admitting: Neurology

## 2020-12-31 ENCOUNTER — Encounter: Payer: Self-pay | Admitting: Neurology

## 2020-12-31 DIAGNOSIS — Z029 Encounter for administrative examinations, unspecified: Secondary | ICD-10-CM

## 2021-02-14 ENCOUNTER — Other Ambulatory Visit: Payer: Self-pay | Admitting: Family

## 2021-02-25 ENCOUNTER — Ambulatory Visit: Payer: Medicare Other | Admitting: Family

## 2021-03-28 ENCOUNTER — Encounter: Payer: Self-pay | Admitting: Family

## 2021-12-05 ENCOUNTER — Telehealth: Payer: Self-pay | Admitting: Family

## 2022-02-24 ENCOUNTER — Other Ambulatory Visit (HOSPITAL_COMMUNITY): Payer: Self-pay | Admitting: Family

## 2022-02-24 DIAGNOSIS — R002 Palpitations: Secondary | ICD-10-CM

## 2022-03-05 ENCOUNTER — Ambulatory Visit (HOSPITAL_COMMUNITY): Payer: Medicare PPO

## 2022-04-04 ENCOUNTER — Other Ambulatory Visit: Payer: Self-pay

## 2022-04-04 ENCOUNTER — Inpatient Hospital Stay
Admission: RE | Admit: 2022-04-04 | Discharge: 2022-04-04 | Disposition: A | Discharge status: Home / Self Care | Payer: Medicare PPO | Source: Ambulatory Visit | Attending: Family | Admitting: Family

## 2022-04-04 DIAGNOSIS — R002 Palpitations: Secondary | ICD-10-CM | POA: Insufficient documentation

## 2022-04-11 LAB — 3 DAY EXTENDED HOLTER MONITOR
Isolated SVE count: 14890 episodes
Isolated VE Counts: 2562 episodes
Longest supraventricular tachycardia episode - heart rate (: 134 {beats}/min
Longest supraventricular tachycardia episode - number of be: 9 beats
SVE Couplets Counts: 2462 episodes
SVE Triplets Counts: 312 episodes
Supraventricular tachycardia - heart rate (average): 132 {beats}/min
Supraventricular tachycardia - number of episodes: 47
Supraventricular tachycardia with fastest heart rate - dura: 1.4 s
Supraventricular tachycardia with fastest heart rate - hear: 183 {beats}/min
Supraventricular tachycardia with fastest heart rate - numb: 5 beats

## 2022-04-13 LAB — 3 DAY EXTENDED HOLTER MONITOR
Heart rate (average): 80 {beats}/min
Longest supraventricular tachycardia episode - duration: 3.9 s

## 2022-04-25 ENCOUNTER — Observation Stay
Admission: EM | Admit: 2022-04-25 | Discharge: 2022-04-26 | Disposition: A | Discharge status: Home / Self Care | Payer: Medicare PPO | Attending: Internal Medicine | Admitting: Internal Medicine

## 2022-04-25 ENCOUNTER — Observation Stay (HOSPITAL_COMMUNITY): Payer: Medicare PPO | Admitting: Family Medicine

## 2022-04-25 ENCOUNTER — Observation Stay (HOSPITAL_COMMUNITY): Payer: Medicare PPO

## 2022-04-25 ENCOUNTER — Encounter (HOSPITAL_COMMUNITY): Payer: Self-pay

## 2022-04-25 ENCOUNTER — Other Ambulatory Visit: Payer: Self-pay

## 2022-04-25 ENCOUNTER — Emergency Department (EMERGENCY_DEPARTMENT_HOSPITAL): Payer: Medicare PPO

## 2022-04-25 DIAGNOSIS — R0789 Other chest pain: Principal | ICD-10-CM | POA: Insufficient documentation

## 2022-04-25 DIAGNOSIS — E785 Hyperlipidemia, unspecified: Secondary | ICD-10-CM | POA: Insufficient documentation

## 2022-04-25 DIAGNOSIS — E039 Hypothyroidism, unspecified: Secondary | ICD-10-CM

## 2022-04-25 DIAGNOSIS — R079 Chest pain, unspecified: Secondary | ICD-10-CM | POA: Diagnosis present

## 2022-04-25 DIAGNOSIS — Z87891 Personal history of nicotine dependence: Secondary | ICD-10-CM | POA: Insufficient documentation

## 2022-04-25 DIAGNOSIS — R778 Other specified abnormalities of plasma proteins: Secondary | ICD-10-CM

## 2022-04-25 DIAGNOSIS — Z79899 Other long term (current) drug therapy: Secondary | ICD-10-CM | POA: Insufficient documentation

## 2022-04-25 DIAGNOSIS — F17210 Nicotine dependence, cigarettes, uncomplicated: Secondary | ICD-10-CM

## 2022-04-25 DIAGNOSIS — M549 Dorsalgia, unspecified: Secondary | ICD-10-CM

## 2022-04-25 DIAGNOSIS — I081 Rheumatic disorders of both mitral and tricuspid valves: Secondary | ICD-10-CM | POA: Insufficient documentation

## 2022-04-25 HISTORY — DX: Disorder of thyroid, unspecified: E07.9

## 2022-04-25 LAB — ECG 12 LEAD
Atrial Rate: 62 {beats}/min
Calculated P Axis: 54 degrees
Calculated R Axis: 31 degrees
Calculated T Axis: 84 degrees
PR Interval: 154 ms
QRS Duration: 72 ms
QT Interval: 396 ms
QTC Calculation: 401 ms
Ventricular rate: 62 {beats}/min

## 2022-04-25 LAB — COMPREHENSIVE METABOLIC PANEL, NON-FASTING
ALBUMIN/GLOBULIN RATIO: 1.3 (ref 0.8–1.4)
ALBUMIN: 3.9 g/dL (ref 3.5–5.7)
ALKALINE PHOSPHATASE: 127 U/L — ABNORMAL HIGH (ref 34–104)
ALT (SGPT): 19 U/L (ref 7–52)
ANION GAP: 10 mmol/L (ref 10–20)
AST (SGOT): 23 U/L (ref 13–39)
BILIRUBIN TOTAL: 0.5 mg/dL (ref 0.3–1.2)
BUN/CREA RATIO: 13 (ref 6–22)
BUN: 15 mg/dL (ref 7–25)
CALCIUM, CORRECTED: 9.8 mg/dL (ref 8.9–10.8)
CALCIUM: 9.7 mg/dL (ref 8.6–10.3)
CHLORIDE: 109 mmol/L — ABNORMAL HIGH (ref 98–107)
CO2 TOTAL: 22 mmol/L (ref 21–31)
CREATININE: 1.17 mg/dL (ref 0.60–1.30)
ESTIMATED GFR: 50 mL/min/{1.73_m2} — ABNORMAL LOW (ref >59)
GLOBULIN: 3.1 (ref 2.9–5.4)
GLUCOSE: 131 mg/dL — ABNORMAL HIGH (ref 74–109)
OSMOLALITY, CALCULATED: 284 mOsm/kg (ref 270–290)
POTASSIUM: 3.5 mmol/L (ref 3.5–5.1)
PROTEIN TOTAL: 7 g/dL (ref 6.4–8.9)
SODIUM: 141 mmol/L (ref 136–145)

## 2022-04-25 LAB — CBC WITH DIFF
BASOPHIL #: 0.1 10*3/uL (ref 0.00–0.30)
BASOPHIL %: 1 % (ref 0–3)
EOSINOPHIL #: 0.2 10*3/uL (ref 0.00–0.80)
EOSINOPHIL %: 3 % (ref 0–7)
HCT: 44.9 % (ref 37.0–47.0)
HGB: 15.9 g/dL (ref 12.5–16.0)
LYMPHOCYTE #: 2.4 10*3/uL (ref 1.10–5.00)
LYMPHOCYTE %: 27 % (ref 25–45)
MCH: 32.6 pg — ABNORMAL HIGH (ref 27.0–32.0)
MCHC: 35.4 g/dL (ref 32.0–36.0)
MCV: 92.2 fL (ref 78.0–99.0)
MONOCYTE #: 0.7 10*3/uL (ref 0.00–1.30)
MONOCYTE %: 8 % (ref 0–12)
MPV: 7.9 fL (ref 7.4–10.4)
NEUTROPHIL #: 5.6 10*3/uL (ref 1.80–8.40)
NEUTROPHIL %: 63 % (ref 40–76)
PLATELETS: 292 10*3/uL (ref 140–440)
RBC: 4.87 10*6/uL (ref 4.20–5.40)
RDW: 13 % (ref 11.6–14.8)
WBC: 8.9 10*3/uL (ref 4.0–10.5)
WBCS UNCORRECTED: 8.9 10*3/uL

## 2022-04-25 LAB — URINALYSIS, MICROSCOPIC
RBCS: 2 /hpf (ref <4)
SQUAMOUS EPITHELIAL: <1 /hpf (ref <28)
WBCS: 2 /hpf (ref <6)

## 2022-04-25 LAB — URINALYSIS, MACROSCOPIC
BILIRUBIN: NEGATIVE mg/dL
BLOOD: 0.03 mg/dL
GLUCOSE: NEGATIVE mg/dL
KETONES: NEGATIVE mg/dL
LEUKOCYTES: NEGATIVE WBCs/uL
NITRITE: NEGATIVE
PH: 5 (ref 5.0–9.0)
PROTEIN: NEGATIVE mg/dL
SPECIFIC GRAVITY: 1.01 (ref 1.002–1.030)
UROBILINOGEN: NORMAL mg/dL

## 2022-04-25 LAB — TROPONIN-I
TROPONIN I: 7 ng/L (ref <15)
TROPONIN I: 18 ng/L — ABNORMAL HIGH (ref <15)
TROPONIN I: 8 ng/L (ref <15)

## 2022-04-25 LAB — GOLD TOP TUBE

## 2022-04-25 LAB — PHOSPHORUS: PHOSPHORUS: 4.8 mg/dL (ref 3.7–7.2)

## 2022-04-25 LAB — GRAY TOP TUBE

## 2022-04-25 LAB — BLUE TOP TUBE

## 2022-04-25 LAB — HGA1C (HEMOGLOBIN A1C WITH EST AVG GLUCOSE): HEMOGLOBIN A1C: 6.1 % — ABNORMAL HIGH (ref 4.0–6.0)

## 2022-04-25 LAB — B-TYPE NATRIURETIC PEPTIDE: BNP: 107 pg/mL — ABNORMAL HIGH (ref 5–100)

## 2022-04-25 LAB — LIPASE: LIPASE: 3 U/L — ABNORMAL LOW (ref 11–82)

## 2022-04-25 LAB — THYROID STIMULATING HORMONE (SENSITIVE TSH): TSH: 3.599 u[IU]/mL (ref 0.450–5.330)

## 2022-04-25 MED ORDER — REGADENOSON 0.4 MG/5 ML INTRAVENOUS SYRINGE
0.4000 mg | INJECTION | Freq: Once | INTRAVENOUS | Status: AC
  Administered 2022-04-25: 0.4 mg via INTRAVENOUS
  Filled 2022-04-25: qty 5

## 2022-04-25 MED ORDER — SODIUM CHLORIDE 0.9 % INJECTION SOLUTION
2.0000 mL | Freq: Once | INTRAVENOUS | Status: DC | PRN
Start: 2022-04-25 — End: 2022-04-26
  Administered 2022-04-25: 3 mL via INTRAVENOUS

## 2022-04-25 MED ORDER — METHOCARBAMOL 500 MG TABLET
500.0000 mg | ORAL_TABLET | Freq: Two times a day (BID) | ORAL | Status: DC | PRN
Start: 2022-04-25 — End: 2022-04-25

## 2022-04-25 MED ORDER — DOCUSATE SODIUM 100 MG CAPSULE
100.0000 mg | ORAL_CAPSULE | Freq: Two times a day (BID) | ORAL | Status: DC | PRN
Start: 2022-04-25 — End: 2022-04-26

## 2022-04-25 MED ORDER — MAGNESIUM HYDROXIDE 400 MG/5 ML ORAL SUSPENSION
15.0000 mL | Freq: Three times a day (TID) | ORAL | Status: DC | PRN
Start: 2022-04-25 — End: 2022-04-26

## 2022-04-25 MED ORDER — KETOROLAC 30 MG/ML (1 ML) INJECTION SOLUTION
15.0000 mg | Freq: Four times a day (QID) | INTRAMUSCULAR | Status: DC | PRN
Start: 2022-04-25 — End: 2022-04-26
  Administered 2022-04-25 – 2022-04-26 (×2): 15 mg via INTRAVENOUS
  Filled 2022-04-25 (×2): qty 1

## 2022-04-25 MED ORDER — ERGOCALCIFEROL (VITAMIN D2) 1,250 MCG (50,000 UNIT) CAPSULE
50000.0000 [IU] | ORAL_CAPSULE | ORAL | Status: DC
Start: 2022-04-26 — End: 2022-04-26
  Administered 2022-04-26: 50000 [IU] via ORAL
  Filled 2022-04-25: qty 1

## 2022-04-25 MED ORDER — ENOXAPARIN 80 MG/0.8 ML SUBCUTANEOUS SYRINGE
1.0000 mg/kg | INJECTION | SUBCUTANEOUS | Status: AC
Start: 2022-04-25 — End: 2022-04-25
  Administered 2022-04-25: 80 mg via SUBCUTANEOUS

## 2022-04-25 MED ORDER — LEVOTHYROXINE 112 MCG TABLET
112.0000 ug | ORAL_TABLET | Freq: Every day | ORAL | Status: DC
Start: 2022-04-25 — End: 2022-04-26
  Administered 2022-04-25 – 2022-04-26 (×2): 112 ug via ORAL
  Filled 2022-04-25 (×4): qty 1

## 2022-04-25 MED ORDER — IPRATROPIUM 0.5 MG-ALBUTEROL 3 MG (2.5 MG BASE)/3 ML NEBULIZATION SOLN
3.0000 mL | INHALATION_SOLUTION | RESPIRATORY_TRACT | Status: DC | PRN
Start: 2022-04-25 — End: 2022-04-26

## 2022-04-25 MED ORDER — HYDROCODONE 5 MG-ACETAMINOPHEN 325 MG TABLET
1.0000 | ORAL_TABLET | ORAL | Status: DC | PRN
Start: 2022-04-25 — End: 2022-04-26
  Administered 2022-04-25: 1 via ORAL
  Filled 2022-04-25: qty 1

## 2022-04-25 MED ORDER — IBUPROFEN 800 MG TABLET
800.0000 mg | ORAL_TABLET | Freq: Three times a day (TID) | ORAL | Status: DC | PRN
Start: 2022-04-25 — End: 2022-04-26

## 2022-04-25 MED ORDER — ONDANSETRON HCL (PF) 4 MG/2 ML INJECTION SOLUTION
4.0000 mg | Freq: Four times a day (QID) | INTRAMUSCULAR | Status: DC | PRN
Start: 2022-04-25 — End: 2022-04-26

## 2022-04-25 MED ORDER — CYCLOBENZAPRINE 10 MG TABLET
10.0000 mg | ORAL_TABLET | Freq: Three times a day (TID) | ORAL | Status: DC | PRN
Start: 2022-04-25 — End: 2022-04-26

## 2022-04-25 MED ORDER — ACETAMINOPHEN 325 MG TABLET
650.0000 mg | ORAL_TABLET | ORAL | Status: DC | PRN
Start: 2022-04-25 — End: 2022-04-26
  Administered 2022-04-25: 650 mg via ORAL
  Filled 2022-04-25: qty 2

## 2022-04-25 MED ORDER — ENOXAPARIN 80 MG/0.8 ML SUBCUTANEOUS SYRINGE
INJECTION | SUBCUTANEOUS | Status: AC
Start: 2022-04-25 — End: 2022-04-25
  Filled 2022-04-25: qty 0.8

## 2022-04-25 MED ORDER — REGADENOSON 0.4 MG/5 ML INTRAVENOUS SYRINGE
INJECTION | INTRAVENOUS | Status: AC
Start: 2022-04-25 — End: 2022-04-26
  Filled 2022-04-25: qty 5

## 2022-04-25 MED ORDER — PANTOPRAZOLE 40 MG TABLET,DELAYED RELEASE
40.0000 mg | DELAYED_RELEASE_TABLET | Freq: Every day | ORAL | Status: DC
Start: 2022-04-25 — End: 2022-04-26
  Administered 2022-04-25 – 2022-04-26 (×2): 40 mg via ORAL
  Filled 2022-04-25 (×2): qty 1

## 2022-04-25 NOTE — H&P (Signed)
Internal Medicine History and Physical    Requesting Physician: No ref. provider found  History of Present Illness  Jenna Stout is a 71 y.o. female who presents with a chief complaint of Chest Pain  and Back Pain     She presents by EMS emergency department complaining of sharp, aching,  intermittent left-sided chest pain, radiating to back,  over the last week or so. Pain is mostly in thoracic spine. She attributes this to working in her garden. OTC NSAID's / muscle relaxers have not provided much relief.     Denies any previous cardiac history.  Pain recurred this morning while at rest so she called EMS to bring her to the hospital.  She did receive 324 mg of aspirin as well as sublingual nitroglycerin from EMS.  She does report that the nitro has helped with her chest pain.  Denies any fever, chills, nausea or vomiting.    She had a Holter monitor this past Friday, removed Tuesday, for palpitations and states her PCP said no pathology seen.     In ED, cp better with NTG. First troponin was negative, repeat now to 18 and positive. No previous cardiac workup. Former smoker. Received 324 mg ASA from EMS. EKG shows NSR without ST segment changes.    Internal Medicine requested to facilitate admission.     Past History  Current Outpatient Medications   Medication Sig   . ergocalciferol, vitamin D2, (DRISDOL) 1,250 mcg (50,000 unit) Oral Capsule Take 1 Capsule (50,000 Units total) by mouth Every 7 days   . levETIRAcetam (KEPPRA) 500 mg Oral Tablet Take 1 Tablet (500 mg total) by mouth Three times a day   . levothyroxine (SYNTHROID) 112 mcg Oral Tablet Take 1 Tablet (112 mcg total) by mouth Once a day   . methocarbamoL (ROBAXIN) 500 mg Oral Tablet TAKE 1 TABLET TWICE A DAY AS NEEDED FOR NECK PAIN   . omeprazole (PRILOSEC) 40 mg Oral Capsule, Delayed Release(E.C.) Take 1 Capsule (40 mg total) by mouth Once a day   . pravastatin (PRAVACHOL) 40 mg Oral Tablet Take 1 Tablet (40 mg total) by mouth Once a day     No Known  Allergies  Past Medical History:   Diagnosis Date   . Thyroid disease          Past Surgical History:   Procedure Laterality Date   . HX APPENDECTOMY           Family History  Family Medical History:    None         Social History  Social History     Socioeconomic History   . Marital status: Married   Tobacco Use   . Smoking status: Former     Types: Cigarettes   . Smokeless tobacco: Never   Substance and Sexual Activity   . Alcohol use: Never   . Drug use: Never     Review of Systems  Other than ROS in the HPI, all other systems were negative.  Examination  ED Triage Vitals [04/25/22 0659]   BP (Non-Invasive) (!) 165/100   Heart Rate 80   Respiratory Rate 18   Temperature 37.1 C (98.7 F)   SpO2 96 %   Weight 79.4 kg (175 lb)   Height 1.651 m (5\' 5" )     Physical Exam  Vitals reviewed.   Constitutional:       General: She is not in acute distress.     Appearance: Normal appearance. She is well-developed.  HENT:      Head: Normocephalic and atraumatic.   Cardiovascular:      Rate and Rhythm: Normal rate and regular rhythm.      Heart sounds: No murmur heard.  Pulmonary:      Effort: Pulmonary effort is normal. No respiratory distress.      Breath sounds: Normal breath sounds.   Abdominal:      Palpations: Abdomen is soft.      Tenderness: There is no abdominal tenderness.   Musculoskeletal:         General: No swelling.      Cervical back: Neck supple.   Skin:     General: Skin is warm and dry.      Capillary Refill: Capillary refill takes less than 2 seconds.   Neurological:      Mental Status: She is alert.   Psychiatric:         Mood and Affect: Mood normal.     Impression  Assessment/Plan   1. Chest pain, unspecified type    2. Elevated troponin    r/o NSTEMI  Suspect musculoskeletal thoracic pain  Questionable hx. Of seizures ? Syncope ? On Keppra  Hypothyroidism    Plan  Admit to Med-Surg tele  Cardiology has been consulted  Cycle cardiac biomarkers  ASA / Statin / BB  Check Echo  Pt. Is currently receiving  stress test  Home meds  Heparin  Further recommendations per clinical course  Recommendations:  Orders Placed This Encounter   . XR CHEST PA AND LATERAL   . CBC/DIFF   . COMPREHENSIVE METABOLIC PANEL, NON-FASTING   . TROPONIN-I NOW   . TROPONIN-I IN ONE HOUR   . TROPONIN-I IN THREE HOOURS   . CBC WITH DIFF   . EXTRA TUBES   . BLUE TOP TUBE   . GOLD TOP TUBE   . GRAY TOP TUBE   . OXYGEN - NASAL CANNULA   . ECG 12 LEAD   . INSERT & MAINTAIN PERIPHERAL IV ACCESS   . PATIENT CLASS/LEVEL OF CARE DESIGNATION - PRN   . MYOCARDIAL PERFUSION COMPLETE   . enoxaparin PF (LOVENOX) 80 mg/0.8 mL SubQ injection     Monea Pesantez Darci Needle, MD

## 2022-04-25 NOTE — Care Plan (Signed)
Problem: Adult Inpatient Plan of Care  Goal: Plan of Care Review  Outcome: Ongoing (see interventions/notes)  Goal: Patient-Specific Goal (Individualized)  Outcome: Ongoing (see interventions/notes)  Goal: Absence of Hospital-Acquired Illness or Injury  Outcome: Ongoing (see interventions/notes)  Goal: Optimal Comfort and Wellbeing  Outcome: Ongoing (see interventions/notes)  Goal: Rounds/Family Conference  Outcome: Ongoing (see interventions/notes)     Problem: Fall Injury Risk  Goal: Absence of Fall and Fall-Related Injury  Outcome: Ongoing (see interventions/notes)     Problem: Depression  Goal: Improved Mood  Outcome: Ongoing (see interventions/notes)     Problem: Chest Pain  Goal: Resolution of Chest Pain Symptoms  Outcome: Ongoing (see interventions/notes)

## 2022-04-25 NOTE — ED Provider Notes (Signed)
[facility]  ED Primary Provider Note  History of Present Illness   Chief Complaint   Patient presents with   . Chest Pain    . Back Pain     Jenna Stout is a 71 y.o. female who had concerns including Chest Pain  and Back Pain.  Arrival: The patient arrived by Ambulance    71 year old female presents by EMS emergency department complaining of intermittent left-sided chest pain over the last week or so.  Patient reports a sharp aching type pain in the chest.  She intermittently feels like this will radiate around the chest and into the back.  Denies any previous cardiac history.  Pain recurred this morning while at rest so she called EMS to bring her to the hospital.  She did receive 324 mg of aspirin as well as sublingual nitroglycerin from EMS.  She does report that the nitro has helped with her chest pain.  Denies any fever, chills, nausea or vomiting.        Review of Systems   Pertinent positive and negative ROS as per HPI.  Historical Data   History Reviewed This Encounter:      Physical Exam   ED Triage Vitals [04/25/22 0659]   BP (Non-Invasive) (!) 165/100   Heart Rate 80   Respiratory Rate 18   Temperature 37.1 C (98.7 F)   SpO2 96 %   Weight 79.4 kg (175 lb)   Height 1.651 m (_0 )     Physical Exam  Vitals reviewed.   Constitutional:       General: She is not in acute distress.     Appearance: Normal appearance. She is well-developed.   HENT:      Head: Normocephalic and atraumatic.   Cardiovascular:      Rate and Rhythm: Normal rate and regular rhythm.      Heart sounds: No murmur heard.  Pulmonary:      Effort: Pulmonary effort is normal. No respiratory distress.      Breath sounds: Normal breath sounds.   Abdominal:      Palpations: Abdomen is soft.      Tenderness: There is no abdominal tenderness.   Musculoskeletal:         General: No swelling.      Cervical back: Neck supple.   Skin:     General: Skin is warm and dry.      Capillary Refill: Capillary refill takes  less than 2 seconds.   Neurological:      Mental Status: She is alert.   Psychiatric:         Mood and Affect: Mood normal.       Patient Data     Labs Ordered/Reviewed   COMPREHENSIVE METABOLIC PANEL, NON-FASTING - Abnormal; Notable for the following components:       Result Value    CHLORIDE 109 (*)     ESTIMATED GFR 50 (*)     GLUCOSE 131 (*)     ALKALINE PHOSPHATASE 127 (*)     All other components within normal limits    Narrative:     Estimated Glomerular Filtration Rate (eGFR) is calculated using the CKD-EPI (2021) equation, intended for patients 27 years of age and older. If gender is not documented or "unknown", there will be no eGFR calculation.   TROPONIN-I - Abnormal; Notable for the following components:    TROPONIN I 18 (*)     All other components within normal limits  CBC WITH DIFF - Abnormal; Notable for the following components:    MCH 32.6 (*)     All other components within normal limits   TROPONIN-I - Normal   CBC/DIFF    Narrative:     The following orders were created for panel order CBC/DIFF.  Procedure                               Abnormality         Status                     ---------                               -----------         ------                     CBC WITH DIFF[[id number]]                Abnormal            Final result                 Please view results for these tests on the individual orders.   BLUE TOP TUBE   GRAY TOP TUBE   TROPONIN-I   EXTRA TUBES    Narrative:     The following orders were created for panel order EXTRA TUBES.  Procedure                               Abnormality         Status                     ---------                               -----------         ------                     BLUE TOP FYBO[[id number]]                                    Final result               GOLD TOP IDPO[[id number]]                                    In process                 GRAY TOP ERXV[[id number]]                                    Final result                 Please view  results for these tests on the individual orders.   GOLD TOP TUBE     XR CHEST PA AND LATERAL   Final Result by Edi, Radresults In (05/19 0751)   No focal alveolar infiltrate is identified. Bilateral  hyperinflation the lungs is noted. Increased bronchovascular markings are identified bilaterally which may be chronic.         Radiologist location ID: [place] Decision Making  Patient had presented for left-sided chest pain intermittently over the last week.  Differential would include but not limited to acute coronary syndrome, musculoskeletal chest pain, costochondritis, pneumonia.  She would already received aspirin and nitroglycerin EN route.  EKG showed normal sinus rhythm without ST segment changes.  Initial troponin is negative however repeat troponin has now climbed the 18 and now positive.  Chest x-ray shows no acute abnormalities.  Remainder of lab work is grossly unremarkable.  Hospitalist was contacted for possible admission for cardiac workup.    Amount and/or Complexity of Data Reviewed  Labs: ordered.  Radiology: ordered.  ECG/medicine tests: ordered.               Medications Administered in the ED     Clinical Impression   Chest pain, unspecified type (Primary)   Elevated troponin       Disposition: Admitted

## 2022-04-25 NOTE — Consults (Signed)
[place] [facility]  Cardiology Consultation    Date of Service:  04/25/2022  Jenna Stout   71 y.o. female  Date of Admission:  04/25/2022  Date of Birth:  [birth year]    REFERRING PROVIDER:  Andrey Campanile MD    REASON FOR CONSULT:  CHEST PAINS  HPI: Jenna Stout is a 71 y.o., White female who presents with chest pains.  Patient is a 71 year old female who has history of hyperlipidemia and previous history of smoking and stool started having chest pain nearly 2 weeks ago .  The chest pains were recurrent and sharp at time midsternal left-sided and mostly left lower chest area radiating towards back around the chest.  The pains were pretty much persistent and waxing and waning in character and nature with some shortness of breath with onset of chest pain.  She has history of occasional palpitation.  No recent syncope or collapse.  She has no fever or chills or productive cough.  No pleuritic pain or hemoptysis or increased phlegmon production and there is no edema PND or orthopnea.  Patient claimed that she worked in her garden or yd during those time.  And did not lift very heavy things.  Patient was admitted to telemetry as initial nitroglycerin helped relieve her pain per patient.  She was also given aspirin in the ER.  During my evaluation she was comfortable and was not having any chest pain.  She has no diagnosed history of CAD, CHF or valvular heart disease.    HISTORY:   Past Medical:    Past Medical History:   Diagnosis Date   . Thyroid disease       Past Surgical:    Past Surgical History:   Procedure Laterality Date   . HX APPENDECTOMY        Family:    Family Medical History:    None        Social:   reports that she has quit smoking. Her smoking use included cigarettes. She has never used smokeless tobacco. She reports that she does not drink alcohol and does not use drugs.     ALLERGIES:   No Known Allergies    HOME MEDICATIONS:  Medications Prior to Admission     Prescriptions     ergocalciferol, vitamin D2, (DRISDOL) 1,250 mcg (50,000 unit) Oral Capsule    Take 1 Capsule (50,000 Units total) by mouth Every 7 days    levETIRAcetam (KEPPRA) 500 mg Oral Tablet    Take 1 Tablet (500 mg total) by mouth Three times a day    levothyroxine (SYNTHROID) 112 mcg Oral Tablet    Take 1 Tablet (112 mcg total) by mouth Once a day    methocarbamoL (ROBAXIN) 500 mg Oral Tablet    TAKE 1 TABLET TWICE A DAY AS NEEDED FOR NECK PAIN    omeprazole (PRILOSEC) 40 mg Oral Capsule, Delayed Release(E.C.)    Take 1 Capsule (40 mg total) by mouth Once a day    pravastatin (PRAVACHOL) 40 mg Oral Tablet    Take 1 Tablet (40 mg total) by mouth Once a day        CURRENT SCHEDULED MEDICATIONS:  .  [START ON 04/26/2022] ergocalciferol-vitamin D2 (DRISDOL) capsule 50,000 Units Q7 Days  .  levothyroxine (SYNTHROID) tablet 112 mcg Daily  .  pantoprazole (PROTONIX) delayed release tablet 40 mg Daily  .  regadenoson (LEXISCAN) 0.4 mg/5 mL injection ---Cabinet Override     CURRENT PRN MEDICATIONS:  .  acetaminophen (TYLENOL) tablet, 650 mg, Q4H PRN  .  cyclobenzaprine (FLEXERIL) tablet, 10 mg, Q8H PRN  .  docusate sodium (COLACE) capsule, 100 mg, 2x/day PRN  .  HYDROcodone-acetaminophen (NORCO) 5-325 mg per tablet, 1 Tablet, Q4H PRN  .  ibuprofen (MOTRIN) tablet, 800 mg, Q8H PRN  .  ipratropium-albuterol 0.5 mg-3 mg(2.5 mg base)/3 mL Solution for Nebulization, 3 mL, Q4H PRN  .  ketorolac (TORADOL) 30 mg/mL injection, 15 mg, Q6H PRN  .  magnesium hydroxide (MILK OF MAGNESIA)  per 5mL oral liquid, 15 mL, 3x/day PRN  .  ondansetron (ZOFRAN) 2 mg/mL injection, 4 mg, Q6H PRN  .  perflutren lipid microspheres (DEFINITY) 1.3 mL in NS 10 mL (tot vol) injection, 2 mL, Cardiology Once PRN     REVIEW OF SYSTEMS:   GENERAL: The patient denies any weight change, fevers, chills or night sweats.   No dizziness. No LOC.   SKIN: No rashes or sores. No cyanosis or jaundice  HEAD: No trauma, no headache. No dizziness. No tremors  EYES: No  blurriness, no acute visual loss. no discharge.   EARS and nose: No hearing loss, no tinnitus. No discharge.  No epistaxis.  THROAT: No bleeding gums, no sore throat. No drainage. No dysphagia or odynophagia  CARDIAC: Reports + chest pain, no diaphoresis. Denies palpitations. No syncope or collapse.  RESPIRATORY: + shortness of breath, wheezing or cough. no hemoptysis. No pleuritic pain.  GI: Reports no nausea. No vomiting or diarrhea. No abdominal pain. No hematemesis or melena.  GU: No polyuria, no dysuria. No urgency. No hematuria  MUSCULOSKELETAL: No muscle weakness or increased joint stiffness or acute painful joints.   EXTREMITIES: No significant swelling reported. No significant erythema.  NEUROLOGICAL: No numbness, tingling or tremors. No acute paralysis.  HEMATOLOGICAL: No easy bruising or bleeding.  PSYCHIATRIC: No acute major anxiety or acute major depression. No suicidal behavior or ideation.    PHYSICAL EXAMINATION:  Filed Vitals:    04/25/22 0659 04/25/22 1318 04/25/22 1530   BP: (!) 165/100  (!) 152/90   Pulse: 80 57 52   Resp: 18  18   Temp: 37.1 C (98.7 F)     SpO2: 96%  93%      General: Alert, awake and in no acute distress. There is no eye ear or nasal discharge and HEENT is examined and is unremarkable.   Tongue:  Is central and moist no deviation. No cyanosis of the tongue.  Neck: Is supple without evident JVD.  There is no bruit and no palpable thyromegaly or Lymphadenopathy. Adequate carotid upstroke bilaterally.  Lungs: Are essentially clear to auscultation.  Heart: Regular S1-S2. There is no S3 or S4. No systolic murmur. No diastolic murmur. No  gallop, rub or click.  PMI is nondisplaced.  Abdomen: Is soft, bowel sounds are positive and no palpable organomegaly.  Non tender.  Extremities: Revealed no clubbing or cyanosis and there is no significant edema.  Distal Pulses are palpable throughout.  Skin: Has no jaundice or cyanosis.  Neuro and psychiatric and psychological: Normal affect.  Nonagitated. No head tremors noted.                                                                    No  obvious focal deficit.         LABS:   BMP:      Basic Metabolic Profile    Lab Results   Component Value Date/Time    SODIUM 141 04/25/2022 07:11 AM    POTASSIUM 3.5 04/25/2022 07:11 AM    CHLORIDE 109 (H) 04/25/2022 07:11 AM    CO2 22 04/25/2022 07:11 AM    ANIONGAP 10 04/25/2022 07:11 AM    Lab Results   Component Value Date/Time    BUN 15 04/25/2022 07:11 AM    CREATININE 1.17 04/25/2022 07:11 AM    GLUCOSENF 131 (H) 04/25/2022 07:11 AM        CBC: COMPLETE BLOOD COUNT   Lab Results   Component Value Date    WBC 8.9 04/25/2022    HGB 15.9 04/25/2022    HCT 44.9 04/25/2022    PLTCNT 292 04/25/2022       DIFFERENTIAL  Lab Results   Component Value Date    PMNS 63 04/25/2022    LYMPHOCYTES 27 04/25/2022    MONOCYTES 8 04/25/2022    EOSINOPHIL 3 04/25/2022    BASOPHILS 1 04/25/2022    BASOPHILS 0.10 04/25/2022    PMNABS 5.60 04/25/2022    LYMPHSABS 2.40 04/25/2022    EOSABS 0.20 04/25/2022    MONOSABS 0.70 04/25/2022        Hepatic Function:     Hepatic Function    Lab Results   Component Value Date/Time    ALBUMIN 3.9 04/25/2022 07:11 AM    TOTALPROTEIN 7.0 04/25/2022 07:11 AM    ALKPHOS 127 (H) 04/25/2022 07:11 AM    Lab Results   Component Value Date/Time    AST 23 04/25/2022 07:11 AM    ALT 19 04/25/2022 07:11 AM        PT: COAGULATION TESTS  Lab Results   Component Value Date    HCT 44.9 04/25/2022    PLTCNT 292 04/25/2022     Lab Results   Component Value Date    BNP 107 (H) 04/25/2022       TROPONIN I  Lab Results   Component Value Date    TROPONINI 8 04/25/2022    TROPONINI 18 (H) 04/25/2022    TROPONINI 7 04/25/2022     TSH:    Recent Labs     04/25/22  1003   TSH 3.599       CVIS TESTING:  Most Recent EKG This Encounter   ECG 12 LEAD    Collection Time: 04/25/22  7:14 AM   Result Value    Ventricular rate 62    Atrial Rate 62    PR Interval 154    QRS Duration 72    QT Interval 396    QTC Calculation  401    Calculated P Axis 54    Calculated R Axis 31    Calculated T Axis 84    Narrative    Normal sinus rhythm  Nonspecific T wave abnormality  Abnormal ECG  No previous ECGs available  Confirmed by Katrianna Friesenhahn ([id number]) on 04/25/2022 9:35:17 AM      Last Stress Result   MYOCARDIAL PERFUSION COMPLETE    Narrative    RESTING EKG REVEALED:  LOW ATRIAL RHYTHM WITH A RATE OF 53 BEATS PER   MINUTE.  NONSPECIFIC T-WAVE ABNORMALITY.    LEXISCAN STRESS TEST:  PATIENT EXPERIENCED MILD SHORTNESS OF BREATH AND NO   CHEST PAIN AFTER LEXISCAN INFUSION.  HEMODYNAMIC RESPONSE WAS APPROPRIATE.  ISOLATED PAC WAS SEEN.  NO DIAGNOSTIC EKG ST SEGMENT DEPRESSION WAS SEEN.        MYOCARDIAL PERFUSION SCAN:  DURING STRESS STUDY AND AFTER ATTENUATION   CORRECTION, NO SIGNIFICANT PERFUSION DEFECTS WERE SEEN.  LV CAVITY SIZE   WAS NORMAL.  NO SEGMENTAL WALL MOTION ABNORMALITIES WERE SEEN AND   CALCULATED LEFT VENTRICULAR EJECTION FRACTION WAS 71%.  RESTING STUDY WAS   NOT DONE.      Impression      1. NORMAL STUDY.  NO PERFUSION DEFECTS AND NO ISCHEMIA OR   INFARCT.    2. NORMAL LEFT VENTRICULAR CAVITY SIZE, CONTRACTILITY, THICKENING AND   EJECTION FRACTION.        IMAGING:  XR CHEST PA AND LATERAL   Final Result   No focal alveolar infiltrate is identified. Bilateral hyperinflation the lungs is noted. Increased bronchovascular markings are identified bilaterally which may be chronic.         Radiologist location ID: [id number]            Recent Results (from the past [id number] hour(s))   XR CHEST PA AND LATERAL    Collection Time: 04/25/22  7:40 AM    Narrative    Jules Husbands      PROCEDURE DESCRIPTION:XR CHEST PA AND LATERAL    CLINICAL HISTORY: chest pain    COMPARISON:None          FINDINGS:  2 views of the chest were obtained. No focal alveolar infiltrate is identified. Bilateral hyperinflation the lungs is noted. Mildly increased bronchovascular markings are identified bilaterally which may be chronic. Comparison with old films, if  available, would be helpful. The heart size is within limits of normal. Ectasia of the descending thoracic aorta is noted. The mid descending thoracic aorta appears minimally dilated. No pleural effusion or pneumothorax is seen.   Generalized degenerative changes of the thoracic spine are seen.         Impression    No focal alveolar infiltrate is identified. Bilateral hyperinflation the lungs is noted. Increased bronchovascular markings are identified bilaterally which may be chronic.      Radiologist location ID: [id number]     DNR Status:  Full Code    Assessment  Active Hospital Problems    Diagnosis   . Primary Problem: Chest pain       Atypical and noncardiac chest pains.     Hyperlipidemia.     Former smoker.   Moderate mitral valve regurgitation.    Plan and recommendation:      Patient's cardiac stress test was normal.    Echocardiography revealed also normal LV ejection fraction and RV function and there were moderate mitral valve and mild tricuspid regurgitation.  No valvular stenosis.  Her chest pains appear to be atypical.  She can be discharged home from cardiac standpoint whenever medically stable.  Follow-up on mitral valve regurgitation by echocardiography in 2 years is suggested and this may be done earlier depending upon her symptoms.  Continue coronary artery risk factor modification.    Thank you very much for this consult.    Glorianne Manchester MD [facility]   16:08 04/25/2022      This note was partially generated using MModal Fluency Direct system, and there may be some incorrect words, spellings, and punctuation that were not noted in checking the note before saving.

## 2022-04-25 NOTE — ED Triage Notes (Signed)
Chest pain and back pain x 1 week, worsening last night around 1am    PRS:  12 lead, aspirin 324mg , NTG x 2, 22g r hand, 4L NC, BS 144

## 2022-04-26 LAB — TROPONIN-I: TROPONIN I: 7 ng/L (ref <15)

## 2022-04-26 LAB — MAGNESIUM: MAGNESIUM: 2 mg/dL (ref 1.9–2.7)

## 2022-04-26 NOTE — Nurses Notes (Signed)
Pt taken to private vehicle via wheelchair

## 2022-04-26 NOTE — Discharge Summary (Signed)
[facility]  DISCHARGE SUMMARY    PATIENT NAME:  Jenna Stout, Jenna Stout   MRN:  [id number]  DOB:  July 28, 1951    ENCOUNTER DATE:  04/25/2022  INPATIENT ADMISSION DATE:   DISCHARGE DATE:  04/26/2022    ATTENDING PHYSICIAN: Kelby Aline*  SERVICE: PRN HOSPITALIST 5  PRIMARY CARE PHYSICIAN: [facility]       No lay caregiver identified.      PRIMARY DISCHARGE DIAGNOSIS: Chest pain  Active Hospital Problems    Diagnosis Date Noted   . Principal Problem: Chest pain [R07.9] 04/25/2022      Resolved Hospital Problems   No resolved problems to display.     There are no active non-hospital problems to display for this patient.          Current Discharge Medication List      CONTINUE these medications - NO CHANGES were made during your visit.      Details   ergocalciferol (vitamin D2) 1,250 mcg (50,000 unit) Capsule  Commonly known as: DRISDOL   50,000 Units, Oral, EVERY 7 DAYS  Refills: 0     levETIRAcetam 500 mg Tablet  Commonly known as: KEPPRA   500 mg, Oral, 3 TIMES DAILY  Refills: 0     levothyroxine 112 mcg Tablet  Commonly known as: SYNTHROID   112 mcg, Oral, DAILY  Refills: 0     methocarbamoL 500 mg Tablet  Commonly known as: ROBAXIN   TAKE 1 TABLET TWICE A DAY AS NEEDED FOR NECK PAIN  Refills: 0     omeprazole 40 mg Capsule, Delayed Release(E.C.)  Commonly known as: PRILOSEC   40 mg, Oral, DAILY  Refills: 0     pravastatin 40 mg Tablet  Commonly known as: PRAVACHOL   40 mg, Oral, DAILY  Refills: 0          Discharge med list refreshed?  YES     No Known Allergies  HOSPITAL PROCEDURE(S):   No orders of the defined types were placed in this encounter.      REASON FOR HOSPITALIZATION AND HOSPITAL COURSE   BRIEF HPI:  This is a 71 y.o., female admitted for chest pain  BRIEF HOSPITAL NARRATIVE:     patient had normal EKG and CXR. Troponins were normal. Seen by cardiologist. Had negative ECHO and stress test. No more pain. Ok to DC home.  TRANSITION/POST DISCHARGE CARE/PENDING TESTS/REFERRALS:  none    CONDITION ON DISCHARGE:  A. Ambulation: Full ambulation  B. Self-care Ability: Complete  C. Cognitive Status Alert and Oriented x 3  D. Code status at discharge:       LINES/DRAINS/WOUNDS AT DISCHARGE:   Patient Lines/Drains/Airways Status     Active Line / Dialysis Catheter / Dialysis Graft / Drain / Airway / Wound     None                DISCHARGE DISPOSITION:  Home discharge  DISCHARGE INSTRUCTIONS:  Post-Discharge Follow Up Appointments     Follow up with Care, [facility] in 2 week(s)    Phone: [phone]    Where: [address], [place] [address]           DISCHARGE INSTRUCTION - DIET - RESUME HOME DIET     Diet: RESUME HOME DIET      DISCHARGE INSTRUCTION - ACTIVITY - MAY RESUME PREVIOUS ACTIVITY     Activity: MAY RESUME PREVIOUS ACTIVITY      DISCHARGE INSTRUCTION - FOLLOW SAFETY PLAN  Follow safety plan.     DISCHARGE INSTRUCTION - KEEP FOLLOW-UP APPOINTMENTS AS SCHEDULED    Keep follow-up appointments as scheduled.     DISCHARGE INSTRUCTION - TAKE  MEDICATIONS AS ORDERED          Mahalia Longest, MD    Copies sent to Care Team       Relationship Specialty Notifications Start End    Care, [facility] PCP - General EXTERNAL  04/04/22     Phone: [phone] Fax: [phone]         [address] [place] [address]          Referring providers can utilize [email or web address] to access their referred [facility] patient's information.

## 2022-04-26 NOTE — Nurses Notes (Signed)
Pt d/c home, iv and tele removed, d/c instructions on diet, activity, medications and follow-up appts reviewed with pt, pt verbalized understanding of all d/c instructions, vss and waiting for ride

## 2022-04-26 NOTE — Care Plan (Signed)
Problem: Adult Inpatient Plan of Care  Goal: Plan of Care Review  Outcome: Ongoing (see interventions/notes)  Goal: Patient-Specific Goal (Individualized)  Outcome: Ongoing (see interventions/notes)  Goal: Absence of Hospital-Acquired Illness or Injury  Outcome: Ongoing (see interventions/notes)  Goal: Optimal Comfort and Wellbeing  Outcome: Ongoing (see interventions/notes)     Pt has not complained of any chest pain during the night. She is still on room air with no shortness of breath. Will continue to monitor.

## 2022-05-07 LAB — LEVETIRACETAM, SERUM: LEVETIRACETAM: 4.5 ug/mL — ABNORMAL LOW

## 2022-07-17 ENCOUNTER — Other Ambulatory Visit (HOSPITAL_COMMUNITY): Payer: Self-pay | Admitting: Family

## 2022-07-17 DIAGNOSIS — Z1231 Encounter for screening mammogram for malignant neoplasm of breast: Secondary | ICD-10-CM

## 2022-07-24 ENCOUNTER — Ambulatory Visit (HOSPITAL_COMMUNITY): Payer: Self-pay

## 2022-08-01 ENCOUNTER — Other Ambulatory Visit: Payer: Self-pay

## 2022-08-01 ENCOUNTER — Encounter (HOSPITAL_COMMUNITY): Payer: Self-pay

## 2022-08-01 ENCOUNTER — Inpatient Hospital Stay
Admission: RE | Admit: 2022-08-01 | Discharge: 2022-08-01 | Disposition: A | Discharge status: Home / Self Care | Payer: Medicare PPO | Source: Ambulatory Visit | Attending: Family | Admitting: Family

## 2022-08-01 DIAGNOSIS — Z1231 Encounter for screening mammogram for malignant neoplasm of breast: Secondary | ICD-10-CM | POA: Insufficient documentation

## 2022-08-05 ENCOUNTER — Other Ambulatory Visit (HOSPITAL_COMMUNITY): Payer: Self-pay

## 2022-08-05 DIAGNOSIS — Z1231 Encounter for screening mammogram for malignant neoplasm of breast: Secondary | ICD-10-CM

## 2023-01-07 ENCOUNTER — Encounter: Payer: Self-pay | Admitting: Family

## 2023-01-26 ENCOUNTER — Other Ambulatory Visit (HOSPITAL_COMMUNITY): Payer: Self-pay | Admitting: Family

## 2023-01-26 DIAGNOSIS — G8929 Other chronic pain: Secondary | ICD-10-CM

## 2023-02-02 ENCOUNTER — Other Ambulatory Visit: Payer: Self-pay

## 2023-02-02 ENCOUNTER — Inpatient Hospital Stay
Admission: RE | Admit: 2023-02-02 | Discharge: 2023-02-02 | Disposition: A | Discharge status: Home / Self Care | Payer: Medicare PPO | Source: Ambulatory Visit | Attending: Family | Admitting: Family

## 2023-02-02 ENCOUNTER — Other Ambulatory Visit (HOSPITAL_COMMUNITY): Payer: Medicare PPO

## 2023-02-02 DIAGNOSIS — G8929 Other chronic pain: Secondary | ICD-10-CM | POA: Insufficient documentation

## 2023-02-02 DIAGNOSIS — R519 Headache, unspecified: Secondary | ICD-10-CM

## 2023-02-02 LAB — CREATININE WITH EGFR
CREATININE: 0.87 mg/dL (ref 0.60–1.30)
ESTIMATED GFR: 72 mL/min/{1.73_m2} (ref >59)

## 2023-02-02 MED ORDER — GADOBUTROL 10 MMOL/10 ML (1 MMOL/ML) INTRAVENOUS SOLUTION
10.0000 mL | INTRAVENOUS | Status: AC
Start: 2023-02-02 — End: 2023-02-02
  Administered 2023-02-02: 8 mL via INTRAVENOUS

## 2023-06-01 ENCOUNTER — Other Ambulatory Visit (HOSPITAL_COMMUNITY): Payer: Self-pay | Admitting: NURSE PRACTITIONER

## 2023-06-01 DIAGNOSIS — S32010A Wedge compression fracture of first lumbar vertebra, initial encounter for closed fracture: Secondary | ICD-10-CM

## 2023-06-25 ENCOUNTER — Other Ambulatory Visit: Payer: Self-pay

## 2023-06-25 ENCOUNTER — Inpatient Hospital Stay
Admission: RE | Admit: 2023-06-25 | Discharge: 2023-06-25 | Disposition: A | Discharge status: Home / Self Care | Payer: Medicare PPO | Source: Ambulatory Visit | Attending: NURSE PRACTITIONER | Admitting: NURSE PRACTITIONER

## 2023-06-25 DIAGNOSIS — S32010A Wedge compression fracture of first lumbar vertebra, initial encounter for closed fracture: Secondary | ICD-10-CM | POA: Insufficient documentation

## 2023-06-26 ENCOUNTER — Other Ambulatory Visit (HOSPITAL_COMMUNITY): Payer: Self-pay | Admitting: Family

## 2023-06-26 DIAGNOSIS — S32010S Wedge compression fracture of first lumbar vertebra, sequela: Secondary | ICD-10-CM

## 2023-06-29 ENCOUNTER — Other Ambulatory Visit: Payer: Self-pay

## 2023-06-29 ENCOUNTER — Inpatient Hospital Stay
Admission: RE | Admit: 2023-06-29 | Discharge: 2023-06-29 | Disposition: A | Discharge status: Home / Self Care | Payer: Medicare PPO | Source: Ambulatory Visit | Attending: Family | Admitting: Family

## 2023-06-29 DIAGNOSIS — Z780 Asymptomatic menopausal state: Secondary | ICD-10-CM | POA: Insufficient documentation

## 2023-06-29 DIAGNOSIS — S32010S Wedge compression fracture of first lumbar vertebra, sequela: Secondary | ICD-10-CM | POA: Insufficient documentation

## 2023-07-10 ENCOUNTER — Other Ambulatory Visit (HOSPITAL_COMMUNITY): Payer: Self-pay | Admitting: NURSE PRACTITIONER

## 2023-07-10 DIAGNOSIS — S32010A Wedge compression fracture of first lumbar vertebra, initial encounter for closed fracture: Secondary | ICD-10-CM

## 2023-07-10 DIAGNOSIS — M810 Age-related osteoporosis without current pathological fracture: Secondary | ICD-10-CM

## 2023-07-15 ENCOUNTER — Telehealth (HOSPITAL_COMMUNITY): Payer: Self-pay | Admitting: Vascular & Interventional Radiology

## 2023-07-15 NOTE — OR PreOp (Signed)
Attempted to reach pt for Radiology Preprocedure Checklist interview, without answer to home or cell phone listed in contacts.

## 2023-07-15 NOTE — OR PreOp (Signed)
Patient returned phone message left to her.  Spoke to patient regarding procedure tomorrow. Denies any allergies, use of injections or blood thinners, or any personal or family history involving sedation. Communicated to patient that she needs to arrive at 7:30am for appointment, she needs to remain NPO except sips with meds, and she needs to have a driver. Patient verbalized understanding

## 2023-07-16 ENCOUNTER — Inpatient Hospital Stay
Admission: RE | Admit: 2023-07-16 | Discharge: 2023-07-16 | Disposition: A | Discharge status: Home / Self Care | Payer: Medicare PPO | Source: Ambulatory Visit | Attending: NURSE PRACTITIONER | Admitting: NURSE PRACTITIONER

## 2023-07-16 ENCOUNTER — Encounter (HOSPITAL_COMMUNITY): Payer: Self-pay

## 2023-07-16 ENCOUNTER — Other Ambulatory Visit: Payer: Self-pay

## 2023-07-16 DIAGNOSIS — S32010A Wedge compression fracture of first lumbar vertebra, initial encounter for closed fracture: Secondary | ICD-10-CM

## 2023-07-16 DIAGNOSIS — M810 Age-related osteoporosis without current pathological fracture: Secondary | ICD-10-CM

## 2023-07-16 DIAGNOSIS — M8008XA Age-related osteoporosis with current pathological fracture, vertebra(e), initial encounter for fracture: Secondary | ICD-10-CM | POA: Insufficient documentation

## 2023-07-16 MED ORDER — CEFAZOLIN 1 GRAM SOLUTION FOR INJECTION
INTRAMUSCULAR | Status: AC
Start: 2023-07-16 — End: 2023-07-16
  Filled 2023-07-16: qty 20

## 2023-07-16 MED ORDER — LIDOCAINE HCL 20 MG/ML (2 %) INJECTION SOLUTION
INTRAMUSCULAR | Status: AC
Start: 2023-07-16 — End: 2023-07-16
  Filled 2023-07-16: qty 20

## 2023-07-16 MED ORDER — SODIUM CHLORIDE 0.9 % INTRAVENOUS SOLUTION
INTRAVENOUS | Status: DC
Start: 2023-07-16 — End: 2023-07-17

## 2023-07-16 MED ORDER — SODIUM CHLORIDE 0.9 % INTRAVENOUS PIGGYBACK
INJECTION | INTRAVENOUS | Status: AC
Start: 2023-07-16 — End: 2023-07-16
  Filled 2023-07-16: qty 100

## 2023-07-16 MED ORDER — MIDAZOLAM 5 MG/ML INJECTION WRAPPER
INTRAMUSCULAR | Status: AC
Start: 2023-07-16 — End: 2023-07-16
  Filled 2023-07-16: qty 2

## 2023-07-16 MED ORDER — MIDAZOLAM 5 MG/ML INJECTION WRAPPER
Freq: Once | INTRAMUSCULAR | Status: AC | PRN
Start: 2023-07-16 — End: 2023-07-16
  Administered 2023-07-16: 1 mg via INTRAVENOUS
  Administered 2023-07-16: 2 mg via INTRAVENOUS
  Administered 2023-07-16 (×3): 1 mg via INTRAVENOUS

## 2023-07-16 MED ORDER — SODIUM CHLORIDE 0.9 % INTRAVENOUS PIGGYBACK
2.0000 g | INJECTION | Freq: Once | INTRAVENOUS | Status: DC
Start: 2023-07-16 — End: 2023-07-17

## 2023-07-16 MED ORDER — HYDROCODONE 7.5 MG-ACETAMINOPHEN 325 MG TABLET
1.0000 | ORAL_TABLET | Freq: Four times a day (QID) | ORAL | Status: DC | PRN
Start: 2023-07-16 — End: 2023-07-17
  Administered 2023-07-16: 1 via ORAL
  Filled 2023-07-16: qty 1

## 2023-07-16 MED ORDER — FENTANYL (PF) 50 MCG/ML INJECTION WRAPPER
INJECTION | Freq: Once | INTRAMUSCULAR | Status: AC | PRN
Start: 2023-07-16 — End: 2023-07-16
  Administered 2023-07-16: 20 ug via INTRAVENOUS
  Administered 2023-07-16: 15 ug via INTRAVENOUS
  Administered 2023-07-16: 25 ug via INTRAVENOUS

## 2023-07-16 MED ORDER — LIDOCAINE HCL 20 MG/ML (2 %) INJECTION SOLUTION
Freq: Once | INTRAMUSCULAR | Status: AC | PRN
Start: 2023-07-16 — End: 2023-07-16
  Administered 2023-07-16: 8 mL via INTRADERMAL

## 2023-07-16 MED ORDER — CEFAZOLIN 1 GRAM SOLUTION FOR INJECTION
Freq: Once | INTRAMUSCULAR | Status: AC | PRN
Start: 2023-07-16 — End: 2023-07-16
  Administered 2023-07-16: 2 g via INTRAVENOUS

## 2023-07-16 MED ORDER — FENTANYL (PF) 50 MCG/ML INJECTION SOLUTION
INTRAMUSCULAR | Status: AC
Start: 2023-07-16 — End: 2023-07-16
  Filled 2023-07-16: qty 4

## 2023-07-16 NOTE — Brief Op Note (Signed)
[facility]  Interventional Radiology    Subjective:  Patient complaining of mild pain at the kyphoplasty site    Objective:  Vital signs stable; neuro status unchanged; ambulating well; dressings clean dry and intact    Assessment:  Doing well status post L1 kyphoplasty    Plan:  Follow up with primary care physician; discharge to home    Neomia Dear, MD 07/16/23 15:11

## 2023-07-16 NOTE — Brief Op Note (Signed)
07/16/23      Radiologist: Dr. Neomia Dear    Procedure: IR KYPHOPLASTY    Description of Procedure Findings:      L1 Vertebral body compression fracture        Estimated Blood Loss:  3.0 mL        Specimen Collected:  1 core bone biopsy specimen            Diagnosis: Compression fracture of L1 lumbar vertebra (CMS HCC)  Age related osteoporosis; as above; dictated report to follow      Neomia Dear, MD

## 2023-07-16 NOTE — Discharge Instructions (Signed)
Keep scheduled appointment with referring Doctor.  Call with any questions or concerns.   Please notify Doctor of persistent headache.

## 2023-07-17 DIAGNOSIS — M810 Age-related osteoporosis without current pathological fracture: Secondary | ICD-10-CM

## 2023-07-17 LAB — SURGICAL PATHOLOGY SPECIMEN

## 2023-07-23 ENCOUNTER — Encounter (INDEPENDENT_AMBULATORY_CARE_PROVIDER_SITE_OTHER): Payer: Self-pay | Admitting: NEUROLOGY

## 2023-07-24 ENCOUNTER — Other Ambulatory Visit: Payer: Self-pay

## 2023-07-24 ENCOUNTER — Encounter (INDEPENDENT_AMBULATORY_CARE_PROVIDER_SITE_OTHER): Payer: Self-pay | Admitting: NEUROLOGY

## 2023-07-24 ENCOUNTER — Ambulatory Visit (INDEPENDENT_AMBULATORY_CARE_PROVIDER_SITE_OTHER): Payer: Medicare PPO | Admitting: NEUROLOGY

## 2023-07-24 VITALS — BP 128/84 | HR 67 | Temp 97.6°F | Ht 65.0 in | Wt 170.6 lb

## 2023-07-24 DIAGNOSIS — G43009 Migraine without aura, not intractable, without status migrainosus: Secondary | ICD-10-CM

## 2023-07-24 DIAGNOSIS — R200 Anesthesia of skin: Secondary | ICD-10-CM

## 2023-07-24 MED ORDER — TOPIRAMATE 25 MG TABLET
50.0000 mg | ORAL_TABLET | Freq: Every evening | ORAL | 0 refills | Status: DC
Start: 2023-07-24 — End: 2023-10-19

## 2023-07-24 NOTE — Progress Notes (Signed)
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 They meet criteria for migraine with severe, often unilateral pain associated with photophobia and nausea lasting 4-5 hours. The only preventative medication she has been on is propranolol which was discontinued to do fatigue/lightheadedness. Interestingly she is on Keppra which she states was started when she began having what she refers to as panic attacks. Description of these episodes is not concerning for seizure activity. I think she can likely discontinue this medication. We will trial topiramate and titrate to response. She is currently overusing medication which we discussed. Rizatriptan is helpful as an abortive.   RIGHT LOWER EXTREMITY NUMBNESS: She reports intermittent numbness of the right leg below knee with prolonged standing. We will obtain EMG.     PLAN  Start topiramate 25 mg QHS (can increase to 50 mg if needed)        Continue rizatriptan 5mg  ODT PRN        Counseled on medication overuse headache        Encouraged aggressive hydration  2.   Obtain NCS/EMG    RTC for electrodiagnostic testing    Thank you for allowing me to participate in your patient's care and please do not hesitate to contact me for any questions or concerns.    Patrick North, DO  Assistant Professor of Neurology  [facility]     [phone]: I will continue to be the provider focal point in managing the chronic complex neurological condition    ==========================================================================================================================================    NAME:  Jenna Stout  DOB:  Apr 07, 1951  VISIT DATE:  07/24/2023    CC:  headache    Patient seen in consultation at the request of Jerold Coombe, FNP  History obtained from the patient and chart/records  Age of patient:  72 y.o.      HPI:   I had the pleasure of seeing your patient in neurology  clinic for an outpatient consultation, who is a 72 y.o. year old female who was referred for evaluation of migraine.  Please allow me to summarize the history for the record.    Headaches began in mid 75s. They start in neck or occiput typically on the right side and radiate forward. They are associated with light sensitivity and nausea. They will often last 4-5 hours. Though recent use of rizatriptan has been helpful. Recently she has been been having headaches every other day but prior to was having near daily headaches. She does not no of any clear triggers. She does not drink much water (maybe 16oz a day). Reports sleep is good. She may snore but has no excessive daytime sleepiness.   She is on Keppra though unsure why. She states it was started when she began having panic attacks described as an overheated feeling with some visual changes that would last 30 minutes or more before returning to normal.   She also reports intermittent numbness of right leg below the knee if she stands for a prolonged period.     PREVIOUS MEDS  Propranolol - lightheaded  ============================================================================================================================================  PMHx  Patient Active Problem List   Diagnosis    Chest pain     Past Surgical History:   Procedure Laterality Date    HX APPENDECTOMY           Family Medical History:       Problem Relation (Age of Onset)  No Known Problems Mother, Father, Sister, Brother, Maternal Grandmother, Maternal Grandfather, Paternal Grandmother, Paternal Grandfather, Daughter, Son, Maternal Aunt, Maternal Uncle, Paternal Aunt, Paternal Uncle, Other            Current Outpatient Medications   Medication Sig Dispense Refill    ergocalciferol, vitamin D2, (DRISDOL) 1,250 mcg (50,000 unit) Oral Capsule Take 1 Capsule (50,000 Units total) by mouth Every 7 days (Patient not taking: Reported on 07/24/2023)      levETIRAcetam (KEPPRA) 500 mg Oral Tablet  Take 1 Tablet (500 mg total) by mouth Three times a day      levothyroxine (SYNTHROID) 112 mcg Oral Tablet Take 1 Tablet (112 mcg total) by mouth Once a day      methocarbamoL (ROBAXIN) 500 mg Oral Tablet TAKE 1 TABLET TWICE A DAY AS NEEDED FOR NECK PAIN      omeprazole (PRILOSEC) 40 mg Oral Capsule, Delayed Release(E.C.) Take 1 Capsule (40 mg total) by mouth Once a day      pravastatin (PRAVACHOL) 40 mg Oral Tablet Take 1 Tablet (40 mg total) by mouth Once a day       No current facility-administered medications for this visit.     No Known Allergies  Social History     Socioeconomic History    Marital status: Married     Spouse name: Not on file    Number of children: Not on file    Years of education: Not on file    Highest education level: Not on file   Occupational History    Not on file   Tobacco Use    Smoking status: Former     Types: Cigarettes    Smokeless tobacco: Never   Vaping Use    Vaping status: Never Used   Substance and Sexual Activity    Alcohol use: Never    Drug use: Never    Sexual activity: Not on file   Other Topics Concern    Not on file   Social History Narrative    Not on file     Social Determinants of Health     Financial Resource Strain: Not on file   Transportation Needs: Not on file   Social Connections: Not on file   Intimate Partner Violence: Not on file   Housing Stability: Not on file       ============================================================================================================================================  GENERAL EXAMINATION  BP 128/84 (Site: Left Arm, Patient Position: Sitting, Cuff Size: Adult)   Pulse 67   Temp 36.4 C (97.6 F) (Temporal)   Ht 1.651 m (5\' 5" )   Wt 77.4 kg (170 lb 9.6 oz)   SpO2 95%   BMI 28.39 kg/m     Vital signs personally reviewed  General: No acute distress, alert  HEENT: Normocephalic, no scleral icterus  Pulmonary: No accessory muscle use, no tachypnea  Cardiovascular: Heart with regular rate & rhythm  Extremities: No  significant edema, No cyanosis    NEUROLOGIC EXAM  On neurological exam, patient was awake, alert and answering questions appropriately  Speech was fluent, without dysarthria or aphasia.    CN  II: not directly tested, grossly intact  III, IV, VI: extraocular movements intact without nystagmus  V: intact to light touch  VII: face symmetric without weakness  VIII: grossly intact  IX, X: symmetric palatal elevation  XI: normal strength of trapezius and sternocleidomastoid bilaterally  XII: tongue midline with full movements    MOTOR  Bulk: normal  Abnormal Movements: none    Strength:  MRC Grading Scale   Right Left   Deltoid 5 5   Biceps 5 5   Triceps 5 5   Wrist Extension 5 5   Wrist Flexion - -   Finger Extension 5 5   Finger Abduction 5 5   Finger Flexion 5 5   Hip Flexion 5 5   Hip Extension - -   Hip Abduction - -   Hip Adduction - -   Knee Extension 5 5   Knee Flexion 5 5   Ankle Dorsiflexion 5 5   Ankle Plantarflexion 5 5   Toe Extension - -   Toe Flexion - -     REFLEXES   Right Left   Biceps 2 2   Triceps 2 2   Brachioradialis 2 2   Patellar 2 2   Achilles 0 0   Plantar - -   Hoffman - -   Pectoralis - -   Jaw Jerk - -       SENSORY  Light touch: intact throughout    GAIT  General: casual, normal gait    COORDINATION  Finger nose finger: normal    ================================================================================================================================LABS  Personal Review of prior labs is notable for:   2024  TSH low, Free T3 normal  CMP largely WNL   B12 515  Folate 5.2  A1c 6.4  Keppra 24.7  IMAGING  Personal Review of imaging is notable for:   MRI Brain w/wo contrast February 2024 - mild to moderate global atrophy, moderate chronic white matter ds    OTHER DIAGNOSTICS  Personal Review of other prior diagnostics is notable for: not applicable

## 2023-07-31 ENCOUNTER — Ambulatory Visit (INDEPENDENT_AMBULATORY_CARE_PROVIDER_SITE_OTHER): Payer: Self-pay | Admitting: NEUROLOGY

## 2023-08-14 ENCOUNTER — Other Ambulatory Visit (HOSPITAL_COMMUNITY): Payer: Self-pay | Admitting: Family

## 2023-08-14 DIAGNOSIS — Z1231 Encounter for screening mammogram for malignant neoplasm of breast: Secondary | ICD-10-CM

## 2023-09-17 ENCOUNTER — Ambulatory Visit (HOSPITAL_COMMUNITY): Payer: Self-pay

## 2023-10-19 ENCOUNTER — Other Ambulatory Visit (INDEPENDENT_AMBULATORY_CARE_PROVIDER_SITE_OTHER): Payer: Self-pay | Admitting: NEUROLOGY

## 2023-11-25 ENCOUNTER — Encounter (HOSPITAL_COMMUNITY): Payer: Self-pay | Admitting: Family

## 2023-12-11 ENCOUNTER — Other Ambulatory Visit: Payer: Self-pay

## 2023-12-11 ENCOUNTER — Ambulatory Visit (INDEPENDENT_AMBULATORY_CARE_PROVIDER_SITE_OTHER): Payer: Medicare PPO | Admitting: NEUROLOGY

## 2023-12-11 ENCOUNTER — Encounter (INDEPENDENT_AMBULATORY_CARE_PROVIDER_SITE_OTHER): Payer: Self-pay | Admitting: NEUROLOGY

## 2023-12-11 VITALS — BP 139/88 | HR 87 | Temp 96.8°F | Wt 168.0 lb

## 2023-12-11 DIAGNOSIS — R200 Anesthesia of skin: Secondary | ICD-10-CM

## 2023-12-11 DIAGNOSIS — G43019 Migraine without aura, intractable, without status migrainosus: Secondary | ICD-10-CM

## 2023-12-11 MED ORDER — TOPIRAMATE 100 MG TABLET
100.0000 mg | ORAL_TABLET | Freq: Every evening | ORAL | 0 refills | Status: DC
Start: 2023-12-11 — End: 2024-03-18

## 2023-12-11 NOTE — Progress Notes (Signed)
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 They meet criteria for migraine with severe, often unilateral pain associated with photophobia and nausea lasting 4-5 hours. She has previously trialed propranolol which was discontinued due to fatigue/lightheadedness. Since last visit we started topiramate which she thinks initially helped though she is back to having near daily headaches. (Greater than 15 headache days per month). I will increase her dose to 100 mg. If no improvement is seen, we will plan for Botox as I do think there is a significant musculoskeletal component to her headaches.   RIGHT LOWER EXTREMITY NUMBNESS: She reports intermittent numbness of the right leg below knee with prolonged standing. We will obtain EMG.     PLAN  Increase topiramate to 100 mg QHS (50 bid if better tolerated)        Continue rizatriptan 5mg  ODT PRN        Counseled on medication overuse headache        Encouraged aggressive hydration  2.   Obtain NCS/EMG    RTC for electrodiagnostic testing or sooner for Botox    Thank you for allowing me to participate in your patient's care and please do not hesitate to contact me for any questions or concerns.    Patrick North, DO  Assistant Professor of Neurology  [facility]     [phone]: I will continue to be the provider focal point in managing the chronic complex neurological condition    ==========================================================================================================================================    NAME:  Jenna Stout  DOB:  [birth year]  VISIT DATE:  07/24/2023    CC:  headache    Patient seen in consultation at the request of Jerold Coombe, FNP  History obtained from the patient and chart/records  Age of patient:  73 y.o.    INTERVAL: She believes that topiramate initially helped headaches, and she did increase to 50 mg QHS. She has been  tolerating the medication without issue. Recently she is back to having near daily headaches. She has discontinued Keppra without any concern for seizure. She has increased water intake.     HPI:   I had the pleasure of seeing your patient in neurology clinic for an outpatient consultation, who is a 73 y.o. year old female who was referred for evaluation of migraine.  Please allow me to summarize the history for the record.    Headaches began in mid 73s. They start in neck or occiput typically on the right side and radiate forward. They are associated with light sensitivity and nausea. They will often last 4-5 hours. Though recent use of rizatriptan has been helpful. Recently she has been been having headaches every other day but prior to was having near daily headaches. She does not no of any clear triggers. She does not drink much water (maybe 16oz a day). Reports sleep is good. She may snore but has no excessive daytime sleepiness.   She is on Keppra though unsure why. She states it was started when she began having panic attacks described as an overheated feeling with some visual changes that would last 30 minutes or more before returning to normal.   She also reports intermittent numbness of right leg below the knee if she stands for a prolonged period.     PREVIOUS MEDS  Propranolol - lightheaded    ============================================================================================================================================  PMHx  Patient Active Problem List   Diagnosis  Chest pain     Past Surgical History:   Procedure Laterality Date    HX APPENDECTOMY           Family Medical History:       Problem Relation (Age of Onset)    No Known Problems Mother, Father, Sister, Brother, Maternal Grandmother, Maternal Grandfather, Paternal Grandmother, Paternal Grandfather, Daughter, Son, Maternal Aunt, Maternal Uncle, Paternal Aunt, Paternal Uncle, Other            Current Outpatient Medications    Medication Sig Dispense Refill    ergocalciferol, vitamin D2, (DRISDOL) 1,250 mcg (50,000 unit) Oral Capsule Take 1 Capsule (50,000 Units total) by mouth Every 7 days      levothyroxine (SYNTHROID) 112 mcg Oral Tablet Take 1 Tablet (112 mcg total) by mouth Once a day      methocarbamoL (ROBAXIN) 500 mg Oral Tablet TAKE 1 TABLET TWICE A DAY AS NEEDED FOR NECK PAIN      omeprazole (PRILOSEC) 40 mg Oral Capsule, Delayed Release(E.C.) Take 1 Capsule (40 mg total) by mouth Once a day      pravastatin (PRAVACHOL) 40 mg Oral Tablet Take 1 Tablet (40 mg total) by mouth Once a day      topiramate (TOPAMAX) 100 mg Oral Tablet Take 1 Tablet (100 mg total) by mouth Every evening for 90 days 90 Tablet 0     No current facility-administered medications for this visit.     No Known Allergies  Social History     Socioeconomic History    Marital status: Married     Spouse name: Not on file    Number of children: Not on file    Years of education: Not on file    Highest education level: Not on file   Occupational History    Not on file   Tobacco Use    Smoking status: Former     Types: Cigarettes    Smokeless tobacco: Never   Vaping Use    Vaping status: Never Used   Substance and Sexual Activity    Alcohol use: Never    Drug use: Never    Sexual activity: Not on file   Other Topics Concern    Not on file   Social History Narrative    Not on file     Social Determinants of Health     Financial Resource Strain: Not on file   Transportation Needs: Not on file   Social Connections: Not on file   Intimate Partner Violence: Not on file   Housing Stability: Not on file       ============================================================================================================================================  GENERAL EXAMINATION  BP 139/88 (Site: Left Arm, Patient Position: Sitting, Cuff Size: Adult)   Pulse 87   Temp 36 C (96.8 F) (Temporal)   Wt 76.2 kg (168 lb)   SpO2 94%   BMI 27.96 kg/m     Vital signs personally  reviewed  General: No acute distress, alert  HEENT: Normocephalic, no scleral icterus  Extremities: No significant edema, No cyanosis    NEUROLOGIC EXAM  On neurological exam, patient was awake, alert and answering questions appropriately  Speech was fluent, without dysarthria or aphasia.    CN  II-XII: grossly itnact    MOTOR  Bulk: normal  Abnormal Movements: none    Strength:     MRC Grading Scale   Right Left   Deltoid 5 5   Biceps 5 5   Triceps 5 5   Wrist Extension - -  Wrist Flexion - -   Finger Extension - -   Finger Abduction - -   Finger Flexion - -   Hip Flexion 5 5   Hip Extension - -   Hip Abduction - -   Hip Adduction - -   Knee Extension 5 5   Knee Flexion 5 5   Ankle Dorsiflexion - -   Ankle Plantarflexion - -   Toe Extension - -   Toe Flexion - -     REFLEXES   Right Left   Biceps 2 2   Triceps 2 2   Brachioradialis 2 2   Patellar 2 2   Achilles 0 0   Plantar - -   Hoffman - -   Pectoralis - -   Jaw Jerk - -       SENSORY  Light touch: intact throughout    GAIT  General: casual, normal gait    ================================================================================================================================LABS  Personal Review of prior labs is notable for:   2024  TSH low, Free T3 normal  CMP largely WNL   B12 515  Folate 5.2  A1c 6.4  Keppra 24.7  IMAGING  Personal Review of imaging is notable for:   MRI Brain w/wo contrast February 2024 - mild to moderate global atrophy, moderate chronic white matter ds    OTHER DIAGNOSTICS  Personal Review of other prior diagnostics is notable for: not applicable

## 2023-12-15 ENCOUNTER — Encounter (HOSPITAL_COMMUNITY): Payer: Self-pay

## 2023-12-15 ENCOUNTER — Ambulatory Visit (HOSPITAL_COMMUNITY): Payer: Self-pay

## 2024-03-18 ENCOUNTER — Other Ambulatory Visit (INDEPENDENT_AMBULATORY_CARE_PROVIDER_SITE_OTHER): Payer: Self-pay | Admitting: NEUROLOGY

## 2024-03-25 ENCOUNTER — Ambulatory Visit (INDEPENDENT_AMBULATORY_CARE_PROVIDER_SITE_OTHER): Payer: Self-pay | Admitting: NEUROLOGY

## 2024-03-25 ENCOUNTER — Other Ambulatory Visit: Payer: Self-pay

## 2024-03-25 DIAGNOSIS — R200 Anesthesia of skin: Secondary | ICD-10-CM

## 2024-03-28 NOTE — Procedures (Signed)
 NEUROLOGY, [facility]  [address]  [place] [address]    Procedure Note    Name: Jenna Stout MRN:  [id number]   Date: 03/25/2024 DOB:  [birth year] (73 y.o.)         95860 - NEEDLE ELECTROMYOGRAPHY; 1 EXTREMITY W/ OR W/O RELATED PARASPINAL AREAS (AMB ONLY)    Date/Time: 03/28/2024 12:47 PM    Performed by: Kathe Pallas, DO  Authorized by: Kathe Pallas, DO        ELECTROMYOGRAPHY REPORT    DATE OF SERVICE:  03/28/2024      PERFORMING PHYSICIAN:  Laymon Priest, D.O.      REFERRING PHYSICIAN:  Laymon Priest, D.O.    HISTORY:  She is a 73 year old woman who is referred for electrodiagnostic testing for intermittent right leg numbness. See most recent visit note from January 2025 for detailed history.     EXAMINATION:  See most recent visit note from December 11, 2023 for detailed exam.     NERVE CONDUCTION STUDIES:  1.Normal right sural sensory nerve action potentials (SNAPs) for age  73.Normal right fibular-EDB and tibial-AH compound muscle action potentials (CMAPs).      ELECTROMYOGRAPHY:  Normal studies for the right vastus-lateralis, tibialis anterior, medial gastrocnemius, tensor fascia lata (TFL), and semitendinosus.    IMPRESSION:  This is a normal study.  Specifically, there is no electrophysiologic evidence for a lumbosacral radiculopathy affecting the right lower extremity at this time.     CLINICAL NOTE:   The above findings were discussed with the patient. Her symptoms clearly radiate from the back and travel down lateral aspect of the leg. I do still think this is likely radicular in nature, though likely mild given normal electrodiagnostics. I offered physical therapy, however, it is not bothersome enough to her to pursue this at present.     Veda Gerald, DO  Assistant Professor of Neurology  [facility]

## 2024-04-28 ENCOUNTER — Other Ambulatory Visit: Payer: Self-pay

## 2024-04-28 ENCOUNTER — Encounter (INDEPENDENT_AMBULATORY_CARE_PROVIDER_SITE_OTHER): Payer: Self-pay | Admitting: NEUROLOGY

## 2024-04-28 ENCOUNTER — Ambulatory Visit (INDEPENDENT_AMBULATORY_CARE_PROVIDER_SITE_OTHER): Payer: Self-pay | Admitting: NEUROLOGY

## 2024-04-28 VITALS — BP 113/76 | HR 75 | Temp 96.9°F | Wt 150.0 lb

## 2024-04-28 DIAGNOSIS — G43019 Migraine without aura, intractable, without status migrainosus: Secondary | ICD-10-CM

## 2024-04-28 MED ORDER — ONABOTULINUMTOXINA 100 UNIT SOLUTION FOR INJECTION
55.0000 [IU] | Freq: Once | INTRAMUSCULAR | Status: AC
Start: 2024-04-28 — End: 2024-04-28
  Administered 2024-04-28: 55 [IU] via INTRAMUSCULAR

## 2024-04-28 MED ORDER — ONABOTULINUMTOXINA 100 UNIT SOLUTION FOR INJECTION
100.0000 [IU] | Freq: Once | INTRAMUSCULAR | Status: AC
Start: 2024-04-28 — End: 2024-04-28
  Administered 2024-04-28: 100 [IU] via INTRAMUSCULAR

## 2024-04-28 NOTE — Procedures (Signed)
 NEUROLOGY, [facility]  [address]  [place] [address]    Procedure Note    Name: Jenna Stout MRN:  [id number]   Date: 04/28/2024 DOB:  Mar 09, 1951 (73 y.o.)         [phone] - MUSCLE(S) INNERVATED BY FACIAL, TRIGEMINAL, CERVICAL SPINAL AND ACCESSORY NERVES, BILATERAL (CHRONIC MIGRAINE) (AMB ONLY)    Date/Time: 04/28/2024 2:13 PM    Performed by: Kathe Pallas, DO  Authorized by: Kathe Pallas, DO        Botox Visit Note    Neurotoxin Treatment Clinic: BTX (chemodenervation with botulinum toxin, Botox) for CM (chronic migraine), PREEMPT protocol     Last Visit: N/A    [name] presents for Botox. This will be her first set of injections. Headaches are unchanged from previous visit. She has a headache today.     Injections: 2:1 dilution BoNT-A (onabotulinum toxin A)    Location Left Right Center Number of Injections   Procerus   5 1   Corrugators 5 5  1  each   Frontalis 10 10  2  each   Temporalis 20 20  4  each   Occipitalis 15 15  3  each   Cervical Paraspinals 10 10  2  each   Trapezius 15 15  3  each   Total   155      Type of Toxin: A  Total Units: 155  Discarded Units: 45    Procedure well tolerated.     RTC 12 weeks    S. Higinio Love, DO  Assistant Professor of Neurology  [place]  [facility]

## 2024-06-27 ENCOUNTER — Encounter (HOSPITAL_COMMUNITY): Payer: Self-pay

## 2024-06-27 ENCOUNTER — Emergency Department (HOSPITAL_COMMUNITY)

## 2024-06-27 ENCOUNTER — Other Ambulatory Visit: Payer: Self-pay

## 2024-06-27 ENCOUNTER — Emergency Department
Admission: EM | Admit: 2024-06-27 | Discharge: 2024-07-08 | Disposition: E | Attending: Emergency Medicine | Admitting: Emergency Medicine

## 2024-06-27 DIAGNOSIS — Z4682 Encounter for fitting and adjustment of non-vascular catheter: Secondary | ICD-10-CM

## 2024-06-27 DIAGNOSIS — I4891 Unspecified atrial fibrillation: Secondary | ICD-10-CM | POA: Insufficient documentation

## 2024-06-27 DIAGNOSIS — I213 ST elevation (STEMI) myocardial infarction of unspecified site: Secondary | ICD-10-CM | POA: Insufficient documentation

## 2024-06-27 DIAGNOSIS — I229 Subsequent ST elevation (STEMI) myocardial infarction of unspecified site: Secondary | ICD-10-CM

## 2024-06-27 DIAGNOSIS — R9431 Abnormal electrocardiogram [ECG] [EKG]: Secondary | ICD-10-CM

## 2024-06-27 DIAGNOSIS — R918 Other nonspecific abnormal finding of lung field: Secondary | ICD-10-CM

## 2024-06-27 DIAGNOSIS — R001 Bradycardia, unspecified: Secondary | ICD-10-CM | POA: Insufficient documentation

## 2024-06-27 DIAGNOSIS — I469 Cardiac arrest, cause unspecified: Secondary | ICD-10-CM | POA: Insufficient documentation

## 2024-06-27 LAB — BLOOD GAS W/ CO-OX, LYTES, LACTATE REFLEX
%FIO2 (VENOUS): 100 %
BASE DEFICIT: 11 mmol/L — ABNORMAL HIGH (ref 0.0–3.0)
BICARBONATE (VENOUS): 15.5 mmol/L — ABNORMAL LOW (ref 22.0–29.0)
CARBOXYHEMOGLOBIN: <0.3 % (ref <=3.0)
CHLORIDE: 120 mmol/L — ABNORMAL HIGH (ref 98–107)
GLUCOSE: 79 mg/dL (ref 65–125)
HEMATOCRITRT: 19 % — ABNORMAL LOW (ref 37–50)
HEMOGLOBIN: 6.4 g/dL — CL (ref 12.0–18.0)
IONIZED CALCIUM: 1.26 mmol/L (ref 1.15–1.33)
LACTATE: 10.5 mmol/L (ref <=1.9)
MET-HEMOGLOBIN: <0.7 % (ref <=1.5)
O2 SATURATION (VENOUS): 24.5 % (ref 40.0–85.0)
O2CT: 2.6 %
OXYHEMOGLOBIN: 28.1 % (ref 40.0–80.0)
PCO2 (VENOUS): 52 mmHg — ABNORMAL HIGH (ref 41–51)
PH (VENOUS): 7.13 — CL (ref 7.32–7.43)
PO2 (VENOUS): 24 mmHg (ref 35–50)
SODIUM: 152 mmol/L — ABNORMAL HIGH (ref 136–145)
WHOLE BLOOD POTASSIUM: 3.8 mmol/L (ref 3.5–5.1)

## 2024-06-27 LAB — ECG 12 LEAD
Calculated R Axis: 67 degrees
Calculated T Axis: 76 degrees
QRS Duration: 86 ms
QT Interval: 400 ms
QTC Calculation: 342 ms
Ventricular rate: 44 {beats}/min

## 2024-06-27 MED ORDER — ATROPINE 0.1 MG/ML INJECTION SYRINGE
INJECTION | Freq: Once | INTRAMUSCULAR | Status: AC | PRN
Start: 2024-06-27 — End: 2024-06-27
  Administered 2024-06-27: 1 mg via INTRAVENOUS

## 2024-06-27 MED ORDER — EPINEPHRINE 0.1 MG/ML INJECTION SYRINGE
INJECTION | Freq: Once | INTRAMUSCULAR | Status: AC | PRN
Start: 2024-06-27 — End: 2024-06-27
  Administered 2024-06-27 (×11): 1 mg via INTRAVENOUS

## 2024-06-27 MED ORDER — CALCIUM CHLORIDE 100 MG/ML (10 %) INTRAVENOUS SYRINGE
INJECTION | Freq: Once | INTRAVENOUS | Status: AC | PRN
Start: 2024-06-27 — End: 2024-06-27
  Administered 2024-06-27: 1000 mg via INTRAVENOUS

## 2024-06-27 MED ORDER — NOREPINEPHRINE BITARTRATE 16 MG/250 ML (64 MCG/ML) IN 0.9 % NACL IV
INTRAVENOUS | Status: AC | PRN
Start: 2024-06-27 — End: 2024-06-27
  Administered 2024-06-27: .3 ug/kg/min via INTRAVENOUS

## 2024-06-27 MED ORDER — SODIUM BICARBONATE 8.4 % (1 MEQ/ML) INTRAVENOUS SYRINGE
INJECTION | Freq: Once | INTRAVENOUS | Status: AC | PRN
Start: 2024-06-27 — End: 2024-06-27
  Administered 2024-06-27 (×4): 50 meq via INTRAVENOUS

## 2024-06-27 MED ORDER — DOPAMINE 400 MG/250 ML (1,600 MCG/ML) IN 5 % DEXTROSE INTRAVENOUS SOLN
INTRAVENOUS | Status: AC | PRN
Start: 2024-06-27 — End: 2024-06-27
  Administered 2024-06-27: 10 ug/kg/min via INTRAVENOUS

## 2024-06-27 MED ORDER — NOREPINEPHRINE BITARTRATE 1 MG/ML INTRAVENOUS SOLUTION
INTRAVENOUS | Status: AC
Start: 2024-06-27 — End: 2024-06-27
  Filled 2024-06-27: qty 8

## 2024-06-27 MED ORDER — NOREPINEPHRINE BITARTRATE 1 MG/ML INTRAVENOUS SOLUTION
INTRAVENOUS | Status: AC
Start: 2024-06-27 — End: 2024-06-27
  Filled 2024-06-27: qty 16

## 2024-07-08 NOTE — Code Documentation (Signed)
 Husband requesting patients wedding band. Patients belongings given to husband.

## 2024-07-08 NOTE — Respiratory Therapy (Signed)
 06/28/2024 0505   Invasive Vent Status   Invasive Vent Initiated  Yes   Vent Assessment   Start Time 0505   Oxygen Noting/Safety Checks Bag/Mask Unit at Bedside Per Dept. Policy   Proximal  Temperature 31 C (87.8 F)   H2O Bag (VENT) Initial   Circuit Initial   Inline Suction Catheter Initial   $ Cuff Check (Resp only) Y   Method used MOV   Cuff Inflated   Heart Rate 90   SpO2 (!) 79 %   Airway   Airway Type ETT   EndoTracheal Tube 8.0 24 Lip   Intubation Date/Intubation Time: 07/02/2024 0445   Reason For Intubation: Emergent  Inserted: By Paramedic  Tube Size: 8.0  Position: 24  Landmark: Lip   Airway Secure Bite Block;Device   ET tube measurement (cm) 24 cm   ET Tube Landmark at the Lip   Vent Check   Equipment [place] C6   Mode ASV   Inspired VT (spontaneous) 475 mLs   Expired VT (spontaneous) 454 mLs   Total Rate 15 Breaths Per Minute   Set % MinVol 1000 %   Actual Minute Volume 6.9 Liters   FiO2 100 %   Actual PEEP 8 cmH2O   Set PEEP 8 cmH2O   PIP 21 cmH2O   Plateau Pressure 21 cmH2O   Driving Pressure (DP) (Calculated) 13 cmH20   Static Compliance (Manual Calc) 45   I-Time 1.51 seconds   I:E Ratio 1:1.6   Inspiratory Flow 36.4 L/min   Sensitivity 5   Vent Alarms   Audible Alarms Checked & Functioning   Hi Pressure 40 cmH2O   Lo Pressure 5 cmH2O   High Min Volume 15 Liters   Low Min Volume 4 Liters   Hi RR 40 breaths per minute   Low RR 5 breaths per minute   High Spont VT 1000 mL   Low Spont VT 100 mL   High Set VT 1000 mL   Low Set VT 100 mL   Apnea Alarm 20 seconds     Patient placed on ventilator at this time post code. ET tube secured 24cm at lip.

## 2024-07-08 NOTE — Code Documentation (Signed)
 Patients husband at bedside and decided to terminate efforts. Provider at bedside

## 2024-07-08 NOTE — ED Nurses Note (Signed)
 Medical examiner called at 06:04  M.E. declined @ [phone]  Case # [id number]

## 2024-07-08 NOTE — ED Provider Notes (Signed)
 [facility] - Emergency Department  ED Primary Note  History of Present Illness   Jenna Stout is a 73 y.o. female who had concerns including Cardiac Arrest.  The patient arrives to the ED via EMS in full cardiac arrest.  The patient originally called 911 due to severe back pain.  The EMS crew states the patient is stable upon their arrival.  EMS reports while EN route to the ED the patient sat up screamed in pain and then suddenly became unresponsive.  They state she became pulseless at that point.  They started CPR, placed at [facility] device, and placed an IO in the left proximal tibia.  Upon arrival to the ED the [facility] device was in place providing compressions.  The patient had an LMA in place and was being ventilated via BVM.  The patient has received epinephrine  1 mg IVP at 5 minute intervals x3 doses EN route.    After the patient is transferred to the ED stretcher, the [facility] device was paused and the patient did have a pulse.   The patient is intubated x1 attempt with an 8.0 ET tube.  The pulse short in duration and the patient began to have bradycardia.  Pulse was lost, CPR was continued and epinephrine  1 mg was given.  The patient once regained a pulse.  At this point the patient had atrial fibrillation with a rate in the low 110s.  She was given 1 amp of sodium bicarbonate  and 1 g of calcium  gluconate.  The pulse began to organized.  At this point the patient began to develop significant ST elevations.  An EKG was obtained that shows significant elevations in lead 2 3 AVF V3 V4 V5 and V6.  A dirty epinephrine  drip was initiated.  The patient had a very low blood pressure therefore Levophed  drip was initiated.  The ST segments continue to increase in elevation.  Therefore, Dr. Neomi was contacted who states the patient is too unstable to take to the cath lab.  The heart rate was 40 therefore a dopamine  drip was added.  The pulse was maintained for short period of time however she developed  bradycardia and the pulse was lost.  CPR was initiated.    The code process was continued following ACLS protocol.  Epinephrine  1 mg was administered a 5 minute intervals.  Levophed  drip, dopamine  drips were continued.  The patient had PE a but never regained a pulse.  Bedside echocardiogram showed very minimal contraction at the extreme apex.  The patient's has been arrived and wanted to be in the room.  Therefore he was allowed to be at bedside.  At this point the code process has been ongoing for approximately 15 minutes.  The husband decided to discontinue efforts at this time.  Efforts were discontinued.  Time of death 2.    Physical Exam   ED Triage Vitals [06/07/2024 0450]   BP (Non-Invasive) (!) 165/72   Heart Rate (!) 30   Respiratory Rate (!) 4   Temperature (!) 34.7 C (94.5 F)   SpO2 (!) 85 %   Weight    Height      Physical Exam  Constitutional:       Comments: Unresponsive   HENT:      Head: Normocephalic and atraumatic.   Eyes:      Comments: Fixed and dilated   Pulmonary:      Comments: Bilateral breath sounds with BVM  Abdominal:      General:  Abdomen is flat.      Palpations: Abdomen is soft.   Musculoskeletal:      Right lower leg: No edema.      Left lower leg: No edema.   Skin:     Coloration: Skin is pale.   Neurological:      Comments: Unresponsive       Patient Data   Labs Ordered/Reviewed   BLOOD GAS W/ CO-OX, LYTES, LACTATE REFLEX - Abnormal; Notable for the following components:       Result Value    PH (VENOUS) 7.13 (*)     PCO2 (VENOUS) 52 (*)     BICARBONATE (VENOUS) 15.5 (*)     BASE DEFICIT 11.0 (*)     HEMOGLOBIN 6.4 (*)     HEMATOCRITRT 19 (*)     SODIUM 152 (*)     CHLORIDE 120 (*)     LACTATE 10.5 (*)     All other components within normal limits    Narrative:     Manufacturer does not recommend venous sample for assessment of patient oxygenation status. A reference range for pO2, Oxyhemoglobin and Oxygen Saturation is provided but abnormal results will not flag in Epic.   ADULT  ROUTINE BLOOD CULTURE, SET OF 2 BOTTLES (BACTERIA AND YEAST)   ADULT ROUTINE BLOOD CULTURE, SET OF 2 BOTTLES (BACTERIA AND YEAST)   BLOOD GAS W/ LACTATE REFLEX   CBC/DIFF    Narrative:     The following orders were created for panel order CBC/DIFF.  Procedure                               Abnormality         Status                     ---------                               -----------         ------                     CBC WITH DIFF[[id number]]                                                                 Please view results for these tests on the individual orders.   COMPREHENSIVE METABOLIC PANEL, NON-FASTING   LACTIC ACID LEVEL W/ REFLEX FOR LEVEL >2.0   MAGNESIUM    PT/INR   PTT (PARTIAL THROMBOPLASTIN TIME)   TROPONIN-I   TROPONIN-I   CBC WITH DIFF   LACTIC ACID - FIRST REFLEX   TROPONIN-I     XR AP MOBILE CHEST   Final Result by Edi, Radresults In (07/21 0537)   Endotracheal tube 2.5 cm above the carina.      Hazy veiling opacity overlying the right hemithorax favored to represent a layering pleural effusion.            Radiologist location ID: [facility]           Medical Decision Making        Medical Decision Making  See HPI  Intubation    Date/Time: 06/07/2024 6:07 AM    Performed by: Cleotilde Juliene BIRCH, DO  Authorized by: Cleotilde Juliene BIRCH, DO    Consent:     Consent obtained:  Emergent situation  Procedure details:     Preoxygenation:  Bag valve mask    CPR in progress: yes      Number of attempts:  1  Successful intubation attempt details:     Intubation technique: direct      Laryngoscope blade:  Mac 3    Bougie used: no      Grade view: I      Tube size (mm):  8.0    Tube type:  Cuffed    Tube visualized through cords: yes    Placement assessment:     ETT at teeth/gumline (cm):  24    Tube secured with:  ETT holder    Breath sounds:  Equal    Placement verification: chest rise, colorimetric ETCO2, CXR verification, direct visualization and tube exhalation      CXR findings:  Appropriate position  [employer]    Date/Time: 06/16/2024 6:08 AM    Performed by: Cleotilde Juliene BIRCH, DO  Authorized by: Cleotilde Juliene BIRCH, DO    Consent:     Consent obtained:  Emergent situation  Pre-procedure details:     Indication(s): central venous access and insufficient peripheral access      Hand hygiene: Hand hygiene performed prior to insertion      Skin preparation:  Chlorhexidine  Sedation:     Sedation type:  None  Anesthesia:     Anesthesia method:  None  Procedure details:     Location:  R femoral    Site selection rationale:  CPR in progress    Patient position:  Supine    Procedural supplies:  Triple lumen    Catheter size:  8 Fr    Landmarks identified: yes      Ultrasound guidance: yes      Ultrasound guidance timing: prior to insertion and real time      Sterile ultrasound techniques: Sterile gel and sterile probe covers were used      Number of attempts:  1    Successful placement: yes    Post-procedure details:     Post-procedure:  Dressing applied and line sutured    Assessment:  Blood return through all ports and free fluid flow               Medications Ordered/Administered in the ED   EPINEPHrine  (ADRENALIN ) 0.1 mg/mL injection (1 mg Intravenous Given 06/26/2024 0549)   sodium bicarbonate  1 mEq/mL 50 mL injection (50 mEq Intravenous Given 06/26/2024 0538)     Clinical Impression   Cardiac arrest (Primary)   ST elevation myocardial infarction (STEMI), unspecified artery (CMS HCC)       Disposition: Expired

## 2024-07-08 NOTE — Respiratory Therapy (Signed)
 Latest Reference Range & Units 06/26/2024 05:19   O2CT % 2.6   HEMATOCRITRT 37 - 50 % 19 (L)   O2 SATURATION (VENOUS) 40.0 - 85.0 % 24.5   %FIO2 % 100.0   PH 7.32 - 7.43  7.13 (LL)   PCO2 41 - 51 mm/Hg 52 (H)   PO2 35 - 50 mm/Hg 24   BICARBONATE 22.0 - 29.0 mmol/L 15.5 (L)   BASE DEFICIT 0.0 - 3.0 mmol/L 11.0 (H)   CARBOXYHEMOGLOBIN <=3.0 % <0.3   HEMOGLOBIN 12.0 - 18.0 g/dL 6.4 (LL)   MET-HEMOGLOBIN <=1.5 % <0.7   O2HB 40.0 - 80.0 % 28.1   SODIUM 136 - 145 mmol/L 152 (H)   LACTATE <=1.9 mmol/L 10.5 (HH)   CHLORIDE 98 - 107 mmol/L 120 (H)   GLUCOSE 65 - 125 mg/dL 79   IONIZED CALCIUM  1.15 - 1.33 mmol/L 1.26   WHOLE BLOOD K+ 3.5 - 5.1 mmol/L 3.8   (LL): Data is critically low  [facility]): Data is critically high  (L): Data is abnormally low  (H): Data is abnormally high      VBG results given to Juliene Pinal, DO.

## 2024-07-08 NOTE — ED Triage Notes (Signed)
 To ED via PRS. Called out for back pain 10/10 and SOB. Pt sat up in ambulance and said she had cramp and then passed out in route. HR dropped to 30 and RR dropped to 2.

## 2024-07-08 DEATH — deceased

## 2024-08-04 ENCOUNTER — Ambulatory Visit (INDEPENDENT_AMBULATORY_CARE_PROVIDER_SITE_OTHER): Payer: Self-pay | Admitting: NEUROLOGY
